# Patient Record
Sex: Male | Born: 1955 | Race: White | Hispanic: No | Marital: Married | Smoking: Former smoker
Health system: Southern US, Community
[De-identification: ages and names within clinical notes are randomized; demographics above are authoritative.]

## PROBLEM LIST (undated history)

## (undated) DIAGNOSIS — R5383 Other fatigue: Principal | ICD-10-CM

## (undated) DIAGNOSIS — N289 Disorder of kidney and ureter, unspecified: Secondary | ICD-10-CM

## (undated) DIAGNOSIS — Z85818 Personal history of malignant neoplasm of other sites of lip, oral cavity, and pharynx: Secondary | ICD-10-CM

## (undated) DIAGNOSIS — I2510 Atherosclerotic heart disease of native coronary artery without angina pectoris: Secondary | ICD-10-CM

## (undated) DIAGNOSIS — I10 Essential (primary) hypertension: Secondary | ICD-10-CM

## (undated) DIAGNOSIS — E039 Hypothyroidism, unspecified: Secondary | ICD-10-CM

## (undated) DIAGNOSIS — R634 Abnormal weight loss: Secondary | ICD-10-CM

## (undated) DIAGNOSIS — C801 Malignant (primary) neoplasm, unspecified: Secondary | ICD-10-CM

## (undated) HISTORY — DX: Other fatigue: R53.83

## (undated) HISTORY — PX: TONSILLECTOMY: SUR1361

## (undated) HISTORY — DX: Hypothyroidism, unspecified: E03.9

## (undated) HISTORY — DX: Essential (primary) hypertension: I10

## (undated) HISTORY — DX: Personal history of malignant neoplasm of other sites of lip, oral cavity, and pharynx: Z85.818

## (undated) HISTORY — DX: Abnormal weight loss: R63.4

## (undated) HISTORY — DX: Disorder of kidney and ureter, unspecified: N28.9

## (undated) HISTORY — PX: MANDIBLE SURGERY: SHX707

## (undated) HISTORY — DX: Atherosclerotic heart disease of native coronary artery without angina pectoris: I25.10

## (undated) HISTORY — PX: OTHER SURGICAL HISTORY: SHX169

---

## 2008-11-03 ENCOUNTER — Encounter: Admission: RE | Admit: 2008-11-03 | Discharge: 2008-11-03 | Payer: Self-pay | Admitting: General Surgery

## 2008-11-05 ENCOUNTER — Other Ambulatory Visit: Admission: RE | Admit: 2008-11-05 | Discharge: 2008-11-05 | Payer: Self-pay | Admitting: Otolaryngology

## 2008-11-08 ENCOUNTER — Emergency Department (HOSPITAL_COMMUNITY): Admission: EM | Admit: 2008-11-08 | Discharge: 2008-11-09 | Payer: Self-pay | Admitting: Emergency Medicine

## 2008-12-02 ENCOUNTER — Ambulatory Visit: Admission: RE | Admit: 2008-12-02 | Discharge: 2009-03-02 | Payer: Self-pay | Admitting: Radiation Oncology

## 2008-12-03 ENCOUNTER — Ambulatory Visit: Payer: Self-pay | Admitting: Oncology

## 2008-12-05 LAB — CBC WITH DIFFERENTIAL/PLATELET
BASO%: 0.3 % (ref 0.0–2.0)
HCT: 37.6 % — ABNORMAL LOW (ref 38.4–49.9)
LYMPH%: 30.3 % (ref 14.0–49.0)
MCH: 31.5 pg (ref 27.2–33.4)
MCHC: 35.3 g/dL (ref 32.0–36.0)
MCV: 89.3 fL (ref 79.3–98.0)
MONO#: 0.3 10*3/uL (ref 0.1–0.9)
MONO%: 3.9 % (ref 0.0–14.0)
NEUT%: 64.1 % (ref 39.0–75.0)
Platelets: 251 10*3/uL (ref 140–400)
WBC: 6.9 10*3/uL (ref 4.0–10.3)

## 2008-12-05 LAB — COMPREHENSIVE METABOLIC PANEL
AST: 16 U/L (ref 0–37)
Albumin: 3.8 g/dL (ref 3.5–5.2)
CO2: 23 mEq/L (ref 19–32)
Calcium: 8.9 mg/dL (ref 8.4–10.5)
Total Protein: 7 g/dL (ref 6.0–8.3)

## 2008-12-09 ENCOUNTER — Ambulatory Visit: Payer: Self-pay | Admitting: Dentistry

## 2008-12-09 ENCOUNTER — Encounter: Admission: AD | Admit: 2008-12-09 | Discharge: 2008-12-09 | Payer: Self-pay | Admitting: Dentistry

## 2008-12-16 ENCOUNTER — Ambulatory Visit (HOSPITAL_COMMUNITY): Admission: RE | Admit: 2008-12-16 | Discharge: 2008-12-16 | Payer: Self-pay | Admitting: Radiation Oncology

## 2008-12-29 LAB — CBC WITH DIFFERENTIAL/PLATELET
BASO%: 0.2 % (ref 0.0–2.0)
Basophils Absolute: 0 10*3/uL (ref 0.0–0.1)
EOS%: 0.7 % (ref 0.0–7.0)
HGB: 14.6 g/dL (ref 13.0–17.1)
MCH: 31.5 pg (ref 27.2–33.4)
MONO%: 2.4 % (ref 0.0–14.0)
RBC: 4.64 10*6/uL (ref 4.20–5.82)
RDW: 13.2 % (ref 11.0–14.6)
lymph#: 1.2 10*3/uL (ref 0.9–3.3)

## 2008-12-29 LAB — COMPREHENSIVE METABOLIC PANEL
ALT: 18 U/L (ref 0–53)
AST: 15 U/L (ref 0–37)
Albumin: 4.6 g/dL (ref 3.5–5.2)
Alkaline Phosphatase: 60 U/L (ref 39–117)
BUN: 15 mg/dL (ref 6–23)
Calcium: 9.8 mg/dL (ref 8.4–10.5)
Chloride: 101 mEq/L (ref 96–112)
Potassium: 4.2 mEq/L (ref 3.5–5.3)
Sodium: 138 mEq/L (ref 135–145)
Total Protein: 7.7 g/dL (ref 6.0–8.3)

## 2009-01-08 ENCOUNTER — Ambulatory Visit: Payer: Self-pay | Admitting: Oncology

## 2009-01-12 LAB — BASIC METABOLIC PANEL
BUN: 16 mg/dL (ref 6–23)
CO2: 23 mEq/L (ref 19–32)
Calcium: 9.1 mg/dL (ref 8.4–10.5)
Creatinine, Ser: 0.81 mg/dL (ref 0.40–1.50)
Glucose, Bld: 118 mg/dL — ABNORMAL HIGH (ref 70–99)

## 2009-01-19 LAB — CBC WITH DIFFERENTIAL/PLATELET
BASO%: 0.3 % (ref 0.0–2.0)
EOS%: 0.5 % (ref 0.0–7.0)
MCH: 31.2 pg (ref 27.2–33.4)
MCHC: 35.2 g/dL (ref 32.0–36.0)
MONO#: 0.3 10*3/uL (ref 0.1–0.9)
NEUT%: 78.9 % — ABNORMAL HIGH (ref 39.0–75.0)
RBC: 4.34 10*6/uL (ref 4.20–5.82)
WBC: 5.9 10*3/uL (ref 4.0–10.3)
lymph#: 0.9 10*3/uL (ref 0.9–3.3)

## 2009-01-19 LAB — COMPREHENSIVE METABOLIC PANEL
ALT: 30 U/L (ref 0–53)
AST: 16 U/L (ref 0–37)
CO2: 24 mEq/L (ref 19–32)
Calcium: 9.4 mg/dL (ref 8.4–10.5)
Chloride: 98 mEq/L (ref 96–112)
Creatinine, Ser: 1.38 mg/dL (ref 0.40–1.50)
Sodium: 135 mEq/L (ref 135–145)
Total Bilirubin: 0.5 mg/dL (ref 0.3–1.2)
Total Protein: 7.4 g/dL (ref 6.0–8.3)

## 2009-01-26 LAB — COMPREHENSIVE METABOLIC PANEL
ALT: 10 U/L (ref 0–53)
AST: 10 U/L (ref 0–37)
Alkaline Phosphatase: 65 U/L (ref 39–117)
Creatinine, Ser: 1.18 mg/dL (ref 0.40–1.50)
Sodium: 137 mEq/L (ref 135–145)
Total Bilirubin: 0.4 mg/dL (ref 0.3–1.2)
Total Protein: 7 g/dL (ref 6.0–8.3)

## 2009-01-26 LAB — CBC WITH DIFFERENTIAL/PLATELET
BASO%: 0.1 % (ref 0.0–2.0)
EOS%: 1.8 % (ref 0.0–7.0)
HCT: 35.5 % — ABNORMAL LOW (ref 38.4–49.9)
LYMPH%: 12.2 % — ABNORMAL LOW (ref 14.0–49.0)
MCH: 30.6 pg (ref 27.2–33.4)
MCHC: 35.2 g/dL (ref 32.0–36.0)
MONO%: 3.4 % (ref 0.0–14.0)
NEUT%: 82.5 % — ABNORMAL HIGH (ref 39.0–75.0)
Platelets: 133 10*3/uL — ABNORMAL LOW (ref 140–400)
RBC: 4.09 10*6/uL — ABNORMAL LOW (ref 4.20–5.82)
WBC: 6.7 10*3/uL (ref 4.0–10.3)

## 2009-02-02 ENCOUNTER — Ambulatory Visit: Payer: Self-pay | Admitting: Oncology

## 2009-02-02 LAB — CBC WITH DIFFERENTIAL/PLATELET
Basophils Absolute: 0 10*3/uL (ref 0.0–0.1)
EOS%: 5.5 % (ref 0.0–7.0)
MCH: 30.4 pg (ref 27.2–33.4)
MCHC: 35.2 g/dL (ref 32.0–36.0)
MCV: 86.4 fL (ref 79.3–98.0)
MONO%: 7.6 % (ref 0.0–14.0)
RBC: 4.04 10*6/uL — ABNORMAL LOW (ref 4.20–5.82)
RDW: 12.9 % (ref 11.0–14.6)

## 2009-02-02 LAB — COMPREHENSIVE METABOLIC PANEL
AST: 11 U/L (ref 0–37)
Albumin: 4.2 g/dL (ref 3.5–5.2)
Alkaline Phosphatase: 63 U/L (ref 39–117)
BUN: 29 mg/dL — ABNORMAL HIGH (ref 6–23)
Potassium: 4.3 mEq/L (ref 3.5–5.3)

## 2009-02-05 LAB — CBC WITH DIFFERENTIAL/PLATELET
Basophils Absolute: 0 10*3/uL (ref 0.0–0.1)
Eosinophils Absolute: 0.3 10*3/uL (ref 0.0–0.5)
HGB: 11.4 g/dL — ABNORMAL LOW (ref 13.0–17.1)
LYMPH%: 3.8 % — ABNORMAL LOW (ref 14.0–49.0)
MCV: 88.3 fL (ref 79.3–98.0)
MONO%: 3 % (ref 0.0–14.0)
NEUT#: 3.9 10*3/uL (ref 1.5–6.5)
Platelets: 209 10*3/uL (ref 140–400)
RBC: 3.66 10*6/uL — ABNORMAL LOW (ref 4.20–5.82)

## 2009-02-05 LAB — COMPREHENSIVE METABOLIC PANEL
Alkaline Phosphatase: 58 U/L (ref 39–117)
BUN: 44 mg/dL — ABNORMAL HIGH (ref 6–23)
Creatinine, Ser: 3.13 mg/dL — ABNORMAL HIGH (ref 0.40–1.50)
Glucose, Bld: 133 mg/dL — ABNORMAL HIGH (ref 70–99)
Total Bilirubin: 0.8 mg/dL (ref 0.3–1.2)

## 2009-02-10 LAB — CBC WITH DIFFERENTIAL/PLATELET
Basophils Absolute: 0 10*3/uL (ref 0.0–0.1)
Eosinophils Absolute: 0.1 10*3/uL (ref 0.0–0.5)
HGB: 10.8 g/dL — ABNORMAL LOW (ref 13.0–17.1)
LYMPH%: 11.8 % — ABNORMAL LOW (ref 14.0–49.0)
MCV: 88 fL (ref 79.3–98.0)
MONO%: 4.7 % (ref 0.0–14.0)
NEUT#: 3.3 10*3/uL (ref 1.5–6.5)
Platelets: 188 10*3/uL (ref 140–400)

## 2009-02-10 LAB — COMPREHENSIVE METABOLIC PANEL
Albumin: 3.6 g/dL (ref 3.5–5.2)
Alkaline Phosphatase: 57 U/L (ref 39–117)
BUN: 17 mg/dL (ref 6–23)
CO2: 26 mEq/L (ref 19–32)
Glucose, Bld: 110 mg/dL — ABNORMAL HIGH (ref 70–99)
Potassium: 3.6 mEq/L (ref 3.5–5.3)

## 2009-02-16 LAB — BASIC METABOLIC PANEL
BUN: 18 mg/dL (ref 6–23)
CO2: 24 mEq/L (ref 19–32)
Chloride: 102 mEq/L (ref 96–112)
Glucose, Bld: 136 mg/dL — ABNORMAL HIGH (ref 70–99)
Potassium: 3.9 mEq/L (ref 3.5–5.3)
Sodium: 136 mEq/L (ref 135–145)

## 2009-02-16 LAB — CBC WITH DIFFERENTIAL/PLATELET
Basophils Absolute: 0 10*3/uL (ref 0.0–0.1)
Eosinophils Absolute: 0.1 10*3/uL (ref 0.0–0.5)
HGB: 9.3 g/dL — ABNORMAL LOW (ref 13.0–17.1)
MCV: 88.6 fL (ref 79.3–98.0)
MONO#: 0.1 10*3/uL (ref 0.1–0.9)
MONO%: 7.6 % (ref 0.0–14.0)
NEUT#: 1.1 10*3/uL — ABNORMAL LOW (ref 1.5–6.5)
RDW: 12.9 % (ref 11.0–14.6)
WBC: 1.6 10*3/uL — ABNORMAL LOW (ref 4.0–10.3)
lymph#: 0.3 10*3/uL — ABNORMAL LOW (ref 0.9–3.3)

## 2009-02-25 ENCOUNTER — Ambulatory Visit: Payer: Self-pay | Admitting: Dentistry

## 2009-03-03 ENCOUNTER — Ambulatory Visit: Admission: RE | Admit: 2009-03-03 | Discharge: 2009-04-02 | Payer: Self-pay | Admitting: Radiation Oncology

## 2009-03-18 ENCOUNTER — Ambulatory Visit: Payer: Self-pay | Admitting: Oncology

## 2009-03-18 LAB — CBC WITH DIFFERENTIAL/PLATELET
EOS%: 1 % (ref 0.0–7.0)
MCH: 31.7 pg (ref 27.2–33.4)
MCV: 90 fL (ref 79.3–98.0)
MONO%: 3.9 % (ref 0.0–14.0)
NEUT#: 5.3 10*3/uL (ref 1.5–6.5)
RBC: 3.47 10*6/uL — ABNORMAL LOW (ref 4.20–5.82)
RDW: 14.2 % (ref 11.0–14.6)
lymph#: 0.4 10*3/uL — ABNORMAL LOW (ref 0.9–3.3)

## 2009-03-18 LAB — COMPREHENSIVE METABOLIC PANEL
ALT: 13 U/L (ref 0–53)
AST: 13 U/L (ref 0–37)
Albumin: 4.2 g/dL (ref 3.5–5.2)
Alkaline Phosphatase: 70 U/L (ref 39–117)
Potassium: 3.9 mEq/L (ref 3.5–5.3)
Sodium: 138 mEq/L (ref 135–145)
Total Protein: 7.4 g/dL (ref 6.0–8.3)

## 2009-03-31 ENCOUNTER — Ambulatory Visit (HOSPITAL_COMMUNITY): Admission: RE | Admit: 2009-03-31 | Discharge: 2009-03-31 | Payer: Self-pay | Admitting: Oncology

## 2009-04-22 ENCOUNTER — Ambulatory Visit (HOSPITAL_COMMUNITY): Admission: RE | Admit: 2009-04-22 | Discharge: 2009-04-22 | Payer: Self-pay | Admitting: Radiation Oncology

## 2009-04-28 ENCOUNTER — Ambulatory Visit: Payer: Self-pay | Admitting: Dentistry

## 2009-06-02 ENCOUNTER — Ambulatory Visit: Payer: Self-pay | Admitting: Oncology

## 2009-06-02 ENCOUNTER — Ambulatory Visit (HOSPITAL_COMMUNITY): Admission: RE | Admit: 2009-06-02 | Discharge: 2009-06-02 | Payer: Self-pay | Admitting: Oncology

## 2009-06-04 LAB — CBC WITH DIFFERENTIAL/PLATELET
Basophils Absolute: 0 10*3/uL (ref 0.0–0.1)
EOS%: 1.1 % (ref 0.0–7.0)
HCT: 35.3 % — ABNORMAL LOW (ref 38.4–49.9)
HGB: 12.4 g/dL — ABNORMAL LOW (ref 13.0–17.1)
MCH: 32.7 pg (ref 27.2–33.4)
MONO#: 0.2 10*3/uL (ref 0.1–0.9)
NEUT#: 3.4 10*3/uL (ref 1.5–6.5)
NEUT%: 78.4 % — ABNORMAL HIGH (ref 39.0–75.0)
RDW: 12.9 % (ref 11.0–14.6)
WBC: 4.4 10*3/uL (ref 4.0–10.3)
lymph#: 0.7 10*3/uL — ABNORMAL LOW (ref 0.9–3.3)

## 2009-06-04 LAB — COMPREHENSIVE METABOLIC PANEL
ALT: 10 U/L (ref 0–53)
AST: 11 U/L (ref 0–37)
Albumin: 4.6 g/dL (ref 3.5–5.2)
BUN: 22 mg/dL (ref 6–23)
CO2: 26 mEq/L (ref 19–32)
Calcium: 9.6 mg/dL (ref 8.4–10.5)
Chloride: 104 mEq/L (ref 96–112)
Potassium: 4.5 mEq/L (ref 3.5–5.3)

## 2009-06-04 LAB — TSH: TSH: 2.014 u[IU]/mL (ref 0.350–4.500)

## 2009-09-09 ENCOUNTER — Ambulatory Visit (HOSPITAL_COMMUNITY): Admission: RE | Admit: 2009-09-09 | Discharge: 2009-09-09 | Payer: Self-pay | Admitting: Oncology

## 2009-09-14 ENCOUNTER — Ambulatory Visit: Payer: Self-pay | Admitting: Oncology

## 2009-09-16 LAB — COMPREHENSIVE METABOLIC PANEL
AST: 12 U/L (ref 0–37)
Albumin: 4.7 g/dL (ref 3.5–5.2)
Alkaline Phosphatase: 46 U/L (ref 39–117)
Calcium: 9.4 mg/dL (ref 8.4–10.5)
Creatinine, Ser: 1.38 mg/dL (ref 0.40–1.50)
Glucose, Bld: 107 mg/dL — ABNORMAL HIGH (ref 70–99)
Potassium: 4.4 mEq/L (ref 3.5–5.3)
Total Bilirubin: 0.4 mg/dL (ref 0.3–1.2)
Total Protein: 7.5 g/dL (ref 6.0–8.3)

## 2009-09-16 LAB — CBC WITH DIFFERENTIAL/PLATELET
BASO%: 0.2 % (ref 0.0–2.0)
Basophils Absolute: 0 10*3/uL (ref 0.0–0.1)
EOS%: 1 % (ref 0.0–7.0)
HGB: 12.1 g/dL — ABNORMAL LOW (ref 13.0–17.1)
LYMPH%: 16.6 % (ref 14.0–49.0)
MONO#: 0.2 10*3/uL (ref 0.1–0.9)
MONO%: 5 % (ref 0.0–14.0)
NEUT#: 2.9 10*3/uL (ref 1.5–6.5)
NEUT%: 77.2 % — ABNORMAL HIGH (ref 39.0–75.0)
RDW: 13.2 % (ref 11.0–14.6)

## 2009-11-23 ENCOUNTER — Ambulatory Visit: Payer: Self-pay | Admitting: Oncology

## 2009-11-25 LAB — CBC WITH DIFFERENTIAL/PLATELET
Basophils Absolute: 0 10*3/uL (ref 0.0–0.1)
HCT: 33.4 % — ABNORMAL LOW (ref 38.4–49.9)
HGB: 11.9 g/dL — ABNORMAL LOW (ref 13.0–17.1)
MCH: 32.9 pg (ref 27.2–33.4)
MCHC: 35.6 g/dL (ref 32.0–36.0)
MCV: 92.5 fL (ref 79.3–98.0)
MONO%: 5.5 % (ref 0.0–14.0)
NEUT#: 3 10*3/uL (ref 1.5–6.5)
Platelets: 226 10*3/uL (ref 140–400)
RBC: 3.61 10*6/uL — ABNORMAL LOW (ref 4.20–5.82)
RDW: 13 % (ref 11.0–14.6)

## 2009-11-25 LAB — COMPREHENSIVE METABOLIC PANEL
Chloride: 101 mEq/L (ref 96–112)
Glucose, Bld: 102 mg/dL — ABNORMAL HIGH (ref 70–99)
Potassium: 4.5 mEq/L (ref 3.5–5.3)
Total Bilirubin: 0.5 mg/dL (ref 0.3–1.2)
Total Protein: 6.8 g/dL (ref 6.0–8.3)

## 2010-03-25 ENCOUNTER — Ambulatory Visit: Payer: Self-pay | Admitting: Oncology

## 2010-03-29 ENCOUNTER — Ambulatory Visit (HOSPITAL_COMMUNITY): Admission: RE | Admit: 2010-03-29 | Discharge: 2010-03-29 | Payer: Self-pay | Admitting: Oncology

## 2010-03-29 LAB — CBC WITH DIFFERENTIAL/PLATELET
BASO%: 0.3 % (ref 0.0–2.0)
MCH: 33.3 pg (ref 27.2–33.4)
MCV: 95.2 fL (ref 79.3–98.0)
MONO#: 0.3 10*3/uL (ref 0.1–0.9)
MONO%: 5.5 % (ref 0.0–14.0)
RBC: 3.97 10*6/uL — ABNORMAL LOW (ref 4.20–5.82)
RDW: 14 % (ref 11.0–14.6)

## 2010-03-29 LAB — COMPREHENSIVE METABOLIC PANEL
ALT: 11 U/L (ref 0–53)
AST: 13 U/L (ref 0–37)
Albumin: 4.6 g/dL (ref 3.5–5.2)
BUN: 19 mg/dL (ref 6–23)
Chloride: 102 mEq/L (ref 96–112)
Creatinine, Ser: 1.34 mg/dL (ref 0.40–1.50)
Glucose, Bld: 107 mg/dL — ABNORMAL HIGH (ref 70–99)
Sodium: 136 mEq/L (ref 135–145)
Total Protein: 7.3 g/dL (ref 6.0–8.3)

## 2010-03-29 LAB — LACTATE DEHYDROGENASE: LDH: 111 U/L (ref 94–250)

## 2010-07-23 ENCOUNTER — Other Ambulatory Visit: Payer: Self-pay | Admitting: Radiation Oncology

## 2010-07-24 ENCOUNTER — Other Ambulatory Visit: Payer: Self-pay | Admitting: Radiation Oncology

## 2010-07-25 ENCOUNTER — Encounter: Payer: Self-pay | Admitting: Radiation Oncology

## 2010-07-26 ENCOUNTER — Encounter: Payer: Self-pay | Admitting: Radiation Oncology

## 2010-07-26 ENCOUNTER — Encounter: Payer: Self-pay | Admitting: Oncology

## 2010-08-09 ENCOUNTER — Other Ambulatory Visit (HOSPITAL_COMMUNITY): Payer: Self-pay | Admitting: Oncology

## 2010-08-09 ENCOUNTER — Encounter (HOSPITAL_BASED_OUTPATIENT_CLINIC_OR_DEPARTMENT_OTHER): Payer: 59 | Admitting: Oncology

## 2010-08-09 DIAGNOSIS — C099 Malignant neoplasm of tonsil, unspecified: Secondary | ICD-10-CM

## 2010-08-09 LAB — CBC WITH DIFFERENTIAL/PLATELET
BASO%: 0.2 % (ref 0.0–2.0)
Basophils Absolute: 0 10*3/uL (ref 0.0–0.1)
Eosinophils Absolute: 0.1 10*3/uL (ref 0.0–0.5)
MCH: 32.6 pg (ref 27.2–33.4)
MCHC: 34.8 g/dL (ref 32.0–36.0)
MONO#: 0.2 10*3/uL (ref 0.1–0.9)
NEUT%: 74.3 % (ref 39.0–75.0)
Platelets: 195 10*3/uL (ref 140–400)
WBC: 3.8 10*3/uL — ABNORMAL LOW (ref 4.0–10.3)
lymph#: 0.7 10*3/uL — ABNORMAL LOW (ref 0.9–3.3)

## 2010-08-09 LAB — COMPREHENSIVE METABOLIC PANEL
AST: 11 U/L (ref 0–37)
Albumin: 4.5 g/dL (ref 3.5–5.2)
Alkaline Phosphatase: 41 U/L (ref 39–117)
CO2: 23 mEq/L (ref 19–32)
Chloride: 100 mEq/L (ref 96–112)
Creatinine, Ser: 1.28 mg/dL (ref 0.40–1.50)
Glucose, Bld: 109 mg/dL — ABNORMAL HIGH (ref 70–99)
Potassium: 4.6 mEq/L (ref 3.5–5.3)
Total Bilirubin: 0.4 mg/dL (ref 0.3–1.2)
Total Protein: 7 g/dL (ref 6.0–8.3)

## 2010-08-09 LAB — LACTATE DEHYDROGENASE: LDH: 104 U/L (ref 94–250)

## 2010-08-10 ENCOUNTER — Encounter (HOSPITAL_COMMUNITY): Payer: Self-pay

## 2010-08-10 ENCOUNTER — Ambulatory Visit (HOSPITAL_COMMUNITY)
Admission: RE | Admit: 2010-08-10 | Discharge: 2010-08-10 | Disposition: A | Discharge status: Home / Self Care | Payer: 59 | Source: Ambulatory Visit | Attending: Radiation Oncology | Admitting: Radiation Oncology

## 2010-08-10 DIAGNOSIS — M47812 Spondylosis without myelopathy or radiculopathy, cervical region: Secondary | ICD-10-CM | POA: Insufficient documentation

## 2010-08-10 DIAGNOSIS — C099 Malignant neoplasm of tonsil, unspecified: Secondary | ICD-10-CM | POA: Insufficient documentation

## 2010-08-10 DIAGNOSIS — Z09 Encounter for follow-up examination after completed treatment for conditions other than malignant neoplasm: Secondary | ICD-10-CM | POA: Insufficient documentation

## 2010-08-10 HISTORY — DX: Malignant (primary) neoplasm, unspecified: C80.1

## 2010-08-10 MED ORDER — IOHEXOL 300 MG/ML  SOLN
100.0000 mL | Freq: Once | INTRAMUSCULAR | Status: AC | PRN
  Administered 2010-08-10: 100 mL via INTRAVENOUS

## 2010-08-17 ENCOUNTER — Encounter (HOSPITAL_BASED_OUTPATIENT_CLINIC_OR_DEPARTMENT_OTHER): Payer: 59 | Admitting: Oncology

## 2010-08-17 DIAGNOSIS — C099 Malignant neoplasm of tonsil, unspecified: Secondary | ICD-10-CM

## 2010-10-11 LAB — GLUCOSE, CAPILLARY: Glucose-Capillary: 119 mg/dL — ABNORMAL HIGH (ref 70–99)

## 2010-10-12 LAB — DIFFERENTIAL
Eosinophils Relative: 1 % (ref 0–5)
Lymphocytes Relative: 23 % (ref 12–46)
Lymphs Abs: 2.2 10*3/uL (ref 0.7–4.0)
Monocytes Relative: 4 % (ref 3–12)

## 2010-10-12 LAB — CBC
HCT: 39.3 % (ref 39.0–52.0)
MCV: 89.3 fL (ref 78.0–100.0)
Platelets: 239 10*3/uL (ref 150–400)
WBC: 9.4 10*3/uL (ref 4.0–10.5)

## 2010-10-12 LAB — POCT I-STAT, CHEM 8
Calcium, Ion: 0.96 mmol/L — ABNORMAL LOW (ref 1.12–1.32)
Creatinine, Ser: 1.1 mg/dL (ref 0.4–1.5)
Glucose, Bld: 101 mg/dL — ABNORMAL HIGH (ref 70–99)
HCT: 39 % (ref 39.0–52.0)
Hemoglobin: 13.3 g/dL (ref 13.0–17.0)
Hemoglobin: 14.3 g/dL (ref 13.0–17.0)
Potassium: 3.1 mEq/L — ABNORMAL LOW (ref 3.5–5.1)
Sodium: 133 mEq/L — ABNORMAL LOW (ref 135–145)

## 2010-11-16 NOTE — Consult Note (Signed)
NAMELYONEL, MOREJON NO.:  [id number]   MEDICAL RECORD NO.:  [id number]          PATIENT TYPE:  REC   LOCATION:  [facility]                         FACILITY:  [facility]   PHYSICIAN:  Charlynne Pander, D.D.S.DATE OF BIRTH:  [birth year]   DATE OF CONSULTATION:  12/09/2008  DATE OF DISCHARGE:                                 CONSULTATION   Samuel Leon is a 55 year old male referred by Dr. [name]  for dental consultation.  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  The patient with anticipated  chemoradiation therapy with Dr. Gaylyn Rong and Dr. Michell Heinrich respectively.  The  patient is now seen as part of pre-chemoradiation therapy dental  protocol evaluation.   MEDICAL HISTORY:  1. 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      a.     Status post right tonsillectomy, direct laryngoscopy, and       multiple biopsies on Nov 19, 2008, with Dr. Christia Reading.       Pathology was positive for squamous cell carcinoma involving the       right tonsil.      b.     Anticipated radiation therapy with Dr. Michell Heinrich.      c.     Anticipated chemotherapy with Dr. Gaylyn Rong.  2. Hypertension.  3. History of leg and wrist fractures as a child.   ALLERGIES:  1. HYDROCODONE causes itching and rash.  2. ASPIRIN causes nausea and vomiting.   MEDICATIONS:  1. Lisinopril and hydrochlorothiazide 20/25 daily.  2. Restoril 15 mg at bedtime as needed.  3. Xanax as needed for anxiety.   SOCIAL HISTORY:  The patient is married with 1 son.  [place]  The patient with a history of drinking 5-6 beers daily but  now only drinks an occasional beer from time to time.  The patient with  a history of smoking, but quit approximately in 1980.   FAMILY HISTORY:  Mother died at age of 54 with kidney failure, end-stage  renal disease, and heart attack.  Father is alive at age 22 with a  history of arthritis, coronary  artery disease, and hypertension.   FUNCTIONAL ASSESSMENT:  The patient currently remains independent for  ADLs at this time.   REVIEW OF SYSTEMS:  This is reviewed with the patient and is included in  the dental consultation record.   DENTAL HISTORY:   CHIEF COMPLAINT:  The patient is a pre-chemoradiation therapy dental  protocol evaluation.   HISTORY OF PRESENT ILLNESS:  The patient with recent diagnosis of  squamous cell carcinoma of the tonsil.  The patient with anticipated  chemoradiation therapy.  The patient is now seen as part of pre-  chemoradiation therapy dental protocol evaluation.   The patient currently denies acute toothache, swellings, or abscesses.  The patient was last seen in February 2010 by Dr. Edmon Crape for an  exam, radiographs, and cleaning.  The patient also had one dental  restoration by report.  The patient does not have  any current pending  treatment by his report.  Prior to that, the patient seen Dr. Irven Easterly on an every 9-month basis, but subsequently failing to  noncompliance over the last several years.  The patient attempted to  reestablish routine dental care with Dr. Langston Masker in February.   DENTAL EXAMINATION:  GENERAL:  The patient is a well-developed, well-  nourished male in no acute distress.  VITAL SIGNS:  Blood pressure is 122/91, pulse rate is 73, and  temperature is 98.2.  HEAD AND NECK:  There is significant right neck lymphadenopathy.  The  patient denies acute TMJ symptoms.  There is no left neck  lymphadenopathy noted at this time.  INTRAORAL:  The patient with normal saliva.  There is no evidence of  abscess formation within the mouth.  DENTITION:  The patient has missing tooth numbers 1, 2, 3, 10, 15, 16,  17, and 19.  PERIODONTAL:  The patient with chronic periodontitis with plaque and  calculus accumulations, selective areas of gingival recession and  incipient-to-moderate bone loss.  DENTAL CARIES:  There are  dental caries noted affecting tooth #24.  CROWN OR BRIDGE:  There are multiple crown or bridge restorations which  appear to be acceptable at this time.  PROSTHODONTIC:  There is no history of partial denture fabrication.  OCCLUSION:  The patient with a poor occlusal scheme secondary to  multiple missing teeth, supereruption, and drifting of the unopposed  teeth into the edentulous areas and lack of replacement of all missing  teeth with dental prosthesis.   RADIOGRAPHIC INTERPRETATION:  A panoramic x-ray was taken and  supplemented with a full series of dental radiographs.   There are multiple missing teeth.  There is incipient-to-moderate bone  loss.  There are dental caries noted.  There are multiple dental  restorations noted.  There are previous root canal therapy associated  with tooth #11 and 12 with no obvious persistent periapical pathology.  There is a radiopaque area between tooth #29 and 30 consistent with an  amalgam tattoo diagnosis.   ASSESSMENT:  1. Plaque and calculus accumulations.  2. Selective areas of gingival recession.  3. Incipient-to-moderate bone loss.  4. Multiple missing teeth.  5. Supereruption and drifting of the unopposed teeth into the      edentulous areas.  6. Dental caries.  7. Poor occlusal scheme but a stable occlusion  8. Pigmented area between tooth #29 and 30 consistent with amalgam      tattoo diagnosis.  Radiopaque area is also noted on the dental x-      rays.  9. Poor occlusal scheme, but a stable occlusion.   PLAN/RECOMMENDATIONS:  1. I discussed risks, benefits, complications, and various treatment      options with the patient in relationship to his medical and dental      conditions and anticipated chemoradiation therapy and      chemoradiation therapy side effects to include xerostomia,      radiation caries, trismus, mucositis, taste changes, gum and jaw      bone changes, risk for infection, bleeding, and osteoradionecrosis.       We discussed various treatment options to include no treatment,      selective extraction of tooth #31 and 32 with alveoloplasty to      achieve primary closure, periodontal therapy, dental restorations,      root canal therapy, crown or bridge therapy, implant therapy, and      replacing missing teeth as indicated.  The patient currently agrees      to be referred to an oral surgeon for evaluation for extraction      tooth #31 and 32 with alveoloplasty to achieve primary closure with      IV sedation.  The consult has been scheduled with Dr. Graylon Gunning      for Thursday, December 11, 2008, at 3:15 p.m. with treatment hopefully      to be provided expediently.  The patient will then be seen by Dr.      Langston Masker for dental restoration on tooth #24.  The patient did agree      to impressions today for fabrication of fluoride trays and cavity      protection devices.  The patient will then follow up with      simulation appointment and PET scan as scheduled with Dr.      Michell Heinrich.  This may need to be changed from the current scheduling      due to overall timing of the dental extractions with Dr. Shea Evans.  2. Provided written and verbal information on chemotherapy and your      mouth as well as radiation therapy and your mouth - today.  3. Impressed upper and lower teeth with alginates with lab pour for      fabrication of fluoride trays and cavity protection devices.  I      also provided the patient with a prescription for Fluoroshield      sodium fluoride for use in the fluoride trays.  4. Discussion of findings with Dr. Gaylyn Rong, Dr. Michell Heinrich, Dr. Shea Evans, and      Dr. Langston Masker as indicated to coordinate future care.      Charlynne Pander, D.D.S.  Electronically Signed     RFK/MEDQ  D:  12/09/2008  T:  12/10/2008  Job:  [id number]   cc:   [name], MD  Jethro Bolus, MD  [name], MD  Edmon Crape, DDS  Sable Feil., D.D.S.

## 2011-02-07 ENCOUNTER — Other Ambulatory Visit (HOSPITAL_COMMUNITY): Payer: Self-pay

## 2011-02-10 ENCOUNTER — Other Ambulatory Visit (HOSPITAL_COMMUNITY): Payer: Self-pay

## 2011-02-14 ENCOUNTER — Other Ambulatory Visit (HOSPITAL_COMMUNITY): Payer: Self-pay

## 2011-02-14 ENCOUNTER — Encounter (HOSPITAL_COMMUNITY)
Admission: RE | Admit: 2011-02-14 | Discharge: 2011-02-14 | Disposition: A | Discharge status: Home / Self Care | Payer: 59 | Source: Ambulatory Visit | Attending: Radiation Oncology | Admitting: Radiation Oncology

## 2011-02-14 ENCOUNTER — Encounter (HOSPITAL_COMMUNITY): Payer: Self-pay

## 2011-02-14 DIAGNOSIS — I2510 Atherosclerotic heart disease of native coronary artery without angina pectoris: Secondary | ICD-10-CM | POA: Insufficient documentation

## 2011-02-14 DIAGNOSIS — C760 Malignant neoplasm of head, face and neck: Secondary | ICD-10-CM | POA: Insufficient documentation

## 2011-02-14 MED ORDER — FLUDEOXYGLUCOSE F - 18 (FDG) INJECTION
17.9000 | Freq: Once | INTRAVENOUS | Status: AC | PRN
  Administered 2011-02-14: 17.9 via INTRAVENOUS

## 2011-02-17 ENCOUNTER — Ambulatory Visit
Admission: RE | Admit: 2011-02-17 | Discharge: 2011-02-17 | Disposition: A | Discharge status: Home / Self Care | Payer: 59 | Source: Ambulatory Visit | Attending: Radiation Oncology | Admitting: Radiation Oncology

## 2011-03-01 ENCOUNTER — Other Ambulatory Visit (HOSPITAL_COMMUNITY): Payer: Self-pay | Admitting: Oncology

## 2011-03-01 ENCOUNTER — Encounter (HOSPITAL_BASED_OUTPATIENT_CLINIC_OR_DEPARTMENT_OTHER): Payer: 59 | Admitting: Oncology

## 2011-03-01 DIAGNOSIS — G619 Inflammatory polyneuropathy, unspecified: Secondary | ICD-10-CM

## 2011-03-01 DIAGNOSIS — C099 Malignant neoplasm of tonsil, unspecified: Secondary | ICD-10-CM

## 2011-03-01 LAB — CBC WITH DIFFERENTIAL/PLATELET
BASO%: 0.6 % (ref 0.0–2.0)
Basophils Absolute: 0 10*3/uL (ref 0.0–0.1)
EOS%: 2.4 % (ref 0.0–7.0)
MCH: 32.4 pg (ref 27.2–33.4)
MCHC: 35.2 g/dL (ref 32.0–36.0)
MCV: 92 fL (ref 79.3–98.0)
MONO%: 7 % (ref 0.0–14.0)
RDW: 13 % (ref 11.0–14.6)
lymph#: 0.6 10*3/uL — ABNORMAL LOW (ref 0.9–3.3)

## 2011-03-01 LAB — TSH: TSH: 5.874 u[IU]/mL — ABNORMAL HIGH (ref 0.350–4.500)

## 2011-03-01 LAB — COMPREHENSIVE METABOLIC PANEL
CO2: 24 mEq/L (ref 19–32)
Creatinine, Ser: 1.22 mg/dL (ref 0.50–1.35)
Glucose, Bld: 106 mg/dL — ABNORMAL HIGH (ref 70–99)
Total Bilirubin: 0.4 mg/dL (ref 0.3–1.2)

## 2011-03-01 LAB — LACTATE DEHYDROGENASE: LDH: 114 U/L (ref 94–250)

## 2011-03-11 IMAGING — PT NM PET TUM IMG RESTAG (PS) SKULL BASE T - THIGH
6 series · 25 of 25 positions shown · non-contrast
Comparison: Prior PET of 12/16/2008.  Neck CT of 03/31/2009.

CLINICAL DATA: Subsequent treatment strategy for right-sided

NUCLEAR MEDICINE PET CT SKULL BASE TO THIGH
TECHNIQUE: 16.7 mCi F-18 FDG was injected intravenously via the
right AC.  Full-ring PET imaging was performed from the skull base
through the mid-thighs 60  minutes after injection.  CT data was
obtained and used for attenuation correction and anatomic
localization only.  (This was not acquired as a diagnostic CT
examination.) Dedicated supplementary views were obtained of the
neck.
Fasting Blood Glucose:  107

[Series 1: pet ac · axial · 3.3mm · 4.69mm/px · z∈[-312,-17]mm · 5 of 91 slices shown]
[im 1/91]
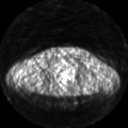
[im 23/91]
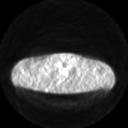
[im 46/91]
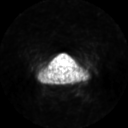
[im 68/91]
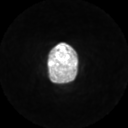
[im 91/91]
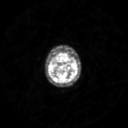

[Series 2: ct h/n · axial · 3.8mm · 0.98mm/px · z∈[-312,-18]mm · 5 of 91 slices shown]
[im 1/91]
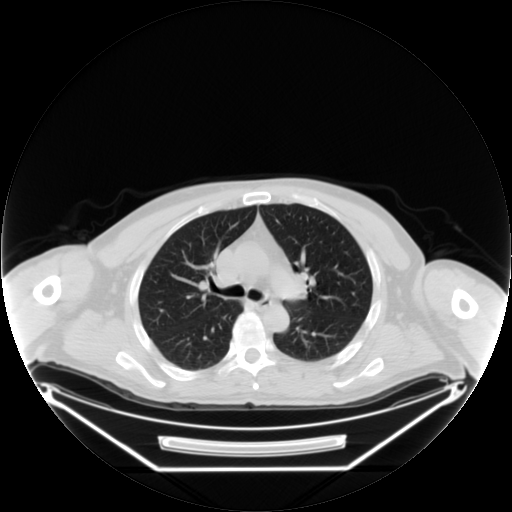
[im 23/91]
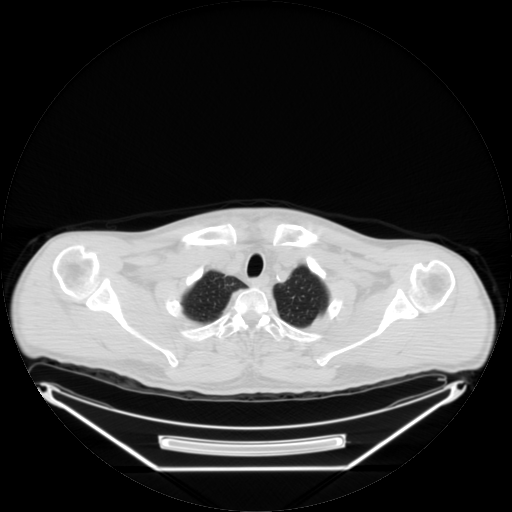
[im 46/91]
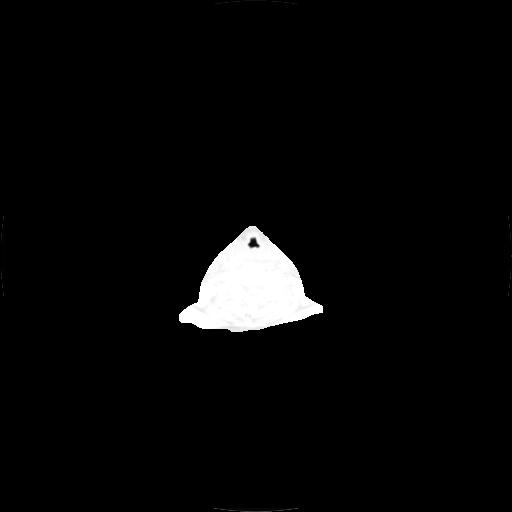
[im 68/91]
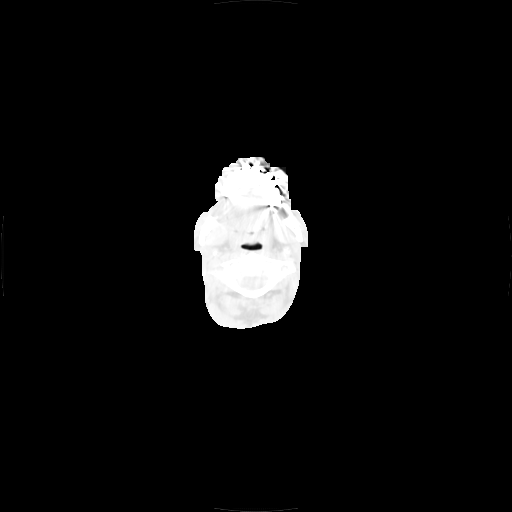
[im 91/91  brain]
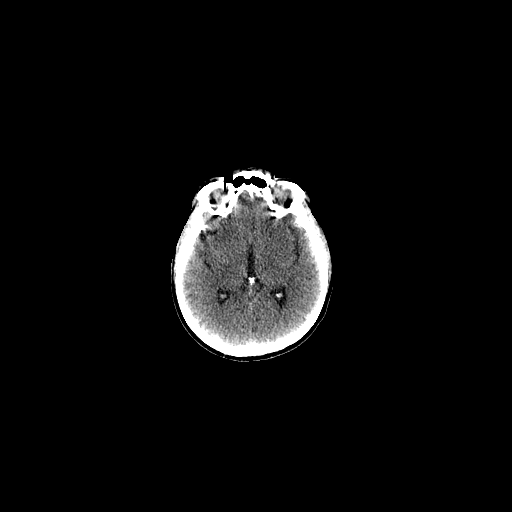

[Series 2: pet nac · axial · 3.3mm · 4.69mm/px · z∈[-312,-17]mm · 5 of 91 slices shown]
[im 1/91]
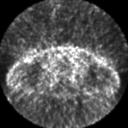
[im 23/91]
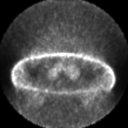
[im 46/91]
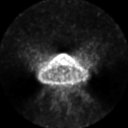
[im 68/91]
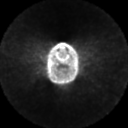
[im 91/91]
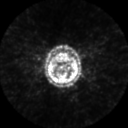

[Series 123: mip · coronal · 3.3mm · 4.69mm/px · 2 of 30 slices shown]
[im 1/30]
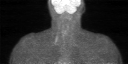
[im 30/30]
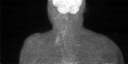

[Series 151: reformatted · axial · 3.3mm · 3.91mm/px · z∈[-312,-17]mm · 5 of 90 slices shown (1 of 2)]
[im 1/90]
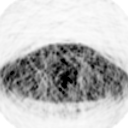
[im 23/90]
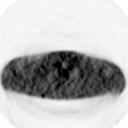
[im 45/90]
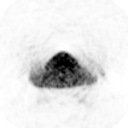
[im 67/90]
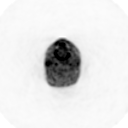
[im 90/90]
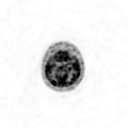

[Series 153: reformatted · coronal · 4.7mm · 4.69mm/px · 3 of 58 slices shown (2 of 2)]
[im 1/58]
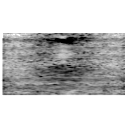
[im 29/58]
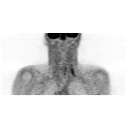
[im 58/58]
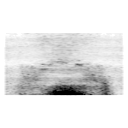

[25 of 25 positions shown; findings below may reference images not displayed]

FINDINGS: PET images demonstrate mild asymmetric right-sided
palatine tonsil hypermetabolism.  Most likely physiologic.  Without
CT correlate.  No hypermetabolic cervical lymph nodes.  No abnormal
activity within the chest, abdomen, or pelvis.

CT images performed for attenuation correction demonstrate the
previous described right-sided jugular chain nodes (on the
03/31/2009 exam) which are poorly visualized due to lack of IV
contrast.  A level II node measures approximately 9 mm on image 35
of series 2 and is similar.

Early but age advanced coronary artery atherosclerosis including
within the LAD on image 103 of series 2.  Urinary bladder wall
thickening versus underdistension.
IMPRESSION: 1.  Response to therapy without evidence of residual hypermetabolic
cervical lymph nodes.
2.  No evidence of extra cervical disease.
3.  Mildly but age advanced coronary artery atherosclerosis.

## 2011-05-23 ENCOUNTER — Telehealth: Payer: Self-pay | Admitting: Medical Oncology

## 2011-05-23 NOTE — Telephone Encounter (Signed)
Pt called and left a message regarding his next appointments

## 2011-05-23 NOTE — Telephone Encounter (Signed)
I called pt back to let them know that we have a new computer system and  the appointments for Jan and Feb. Have  not all been scheduled. I asked her to call me back if he does not hear from the schedulers in the next few weeks to give me a call.

## 2011-07-23 ENCOUNTER — Telehealth: Payer: Self-pay | Admitting: Oncology

## 2011-07-23 NOTE — Telephone Encounter (Signed)
Talked to pt, gave him appt date for 09/06/11, lab and MD

## 2011-09-06 ENCOUNTER — Other Ambulatory Visit: Payer: BC Managed Care – PPO | Admitting: Lab

## 2011-09-06 ENCOUNTER — Encounter: Payer: Self-pay | Admitting: Oncology

## 2011-09-06 ENCOUNTER — Ambulatory Visit (HOSPITAL_BASED_OUTPATIENT_CLINIC_OR_DEPARTMENT_OTHER): Payer: BC Managed Care – PPO | Admitting: Oncology

## 2011-09-06 VITALS — BP 163/95 | HR 98 | Temp 97.6°F | Ht 70.0 in | Wt 187.5 lb

## 2011-09-06 DIAGNOSIS — Z85819 Personal history of malignant neoplasm of unspecified site of lip, oral cavity, and pharynx: Secondary | ICD-10-CM

## 2011-09-06 DIAGNOSIS — E039 Hypothyroidism, unspecified: Secondary | ICD-10-CM

## 2011-09-06 DIAGNOSIS — C099 Malignant neoplasm of tonsil, unspecified: Secondary | ICD-10-CM

## 2011-09-06 LAB — COMPREHENSIVE METABOLIC PANEL
AST: 17 U/L (ref 0–37)
Albumin: 4.2 g/dL (ref 3.5–5.2)
BUN: 16 mg/dL (ref 6–23)
Calcium: 9.7 mg/dL (ref 8.4–10.5)
Chloride: 96 mEq/L (ref 96–112)
Glucose, Bld: 105 mg/dL — ABNORMAL HIGH (ref 70–99)
Potassium: 3.7 mEq/L (ref 3.5–5.3)
Total Protein: 7.5 g/dL (ref 6.0–8.3)

## 2011-09-06 LAB — CBC WITH DIFFERENTIAL/PLATELET
Basophils Absolute: 0 10*3/uL (ref 0.0–0.1)
EOS%: 1.8 % (ref 0.0–7.0)
Eosinophils Absolute: 0.1 10*3/uL (ref 0.0–0.5)
HGB: 13.2 g/dL (ref 13.0–17.1)
NEUT#: 4.1 10*3/uL (ref 1.5–6.5)
RDW: 13.6 % (ref 11.0–14.6)
WBC: 5.3 10*3/uL (ref 4.0–10.3)
lymph#: 0.9 10*3/uL (ref 0.9–3.3)

## 2011-09-06 LAB — TSH: TSH: 3.078 u[IU]/mL (ref 0.350–4.500)

## 2011-09-06 NOTE — Progress Notes (Signed)
CC:   [name], [place] [name], M.D. [name], MD  PROBLEM LIST: 1. Squamous cell carcinoma of the right tonsil, T1 N2b, IVA diagnosed     by biopsy on 11/19/2008.  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  The patient received treatments under the direction of     Dr. Jethro Bolus and Dr. [name].  He received 2 doses of     cisplatin 100 mg/m2 (equal to 210 mg) on 01/12/2009 and 02/02/2009     while at the same time receiving radiation IMRT 70 Gy in 35     fractions from 01/12/2009 through 03/03/2009.  The patient has     remained disease-free since that time. 2. Hypertension. 3. Hypothyroidism. 4. Tinnitus. 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 6. Coronary artery disease by CT scan.  MEDICATIONS: 1. Xanax 0.5 mg at bedtime. 2. Zestoretic 20-25 one tablet daily. 3. Multivitamins. 4. Restoril 15 mg at bedtime. 5. The patient must be on Levothyroxine. However, this is not listed     under his medicines and we do not have his dose.  We will try to     obtain that information.  HISTORY:  I saw Samuel Leon today for followup of his squamous cell carcinoma involving the right tonsil, stage IVA.  Samuel Leon, who is now 56 years old, was last seen by [place] on 03/01/2011.  He feels generally well with no symptoms to suggest recurrent cancer.  He tells me that he saw Dr. Michell Heinrich in August and Dr. Jenne Pane just last month.  There is no evidence for recurrent disease.  He is not having any symptoms related to his primary tumor.  He said that he strained his back yesterday doing some heavy lifting.  This is his only complaint at the present time.  He is eating well and has good energy.  His weight is stable.  PHYSICAL EXAMINATION:  General Appearance:  Samuel Leon looks well.  He looks quite fit and healthy.  Weight is 187.5 pounds.  Height 69 inches. Body surface area 2.05 m2.  Vital Signs:  Blood pressure 163/95.  Other vital signs are normal.   HEENT:  There is no scleral icterus.  Mouth and pharynx are benign.  I was not able to visualize his tonsils.  Neck: Without adenopathy or thyroid enlargement.  Heart:  Normal.  Lungs: Normal.  Abdomen:  Benign with no organomegaly or masses palpable. Extremities:  No peripheral edema or clubbing.  The patient does not have a Port-A-Cath or central catheter.  LABORATORY DATA TODAY:  White count 5.3, ANC 4.1, hemoglobin 13.2, hematocrit 39.1, platelets 238,000.  Chemistries were normal, except for a sodium of 133 and a glucose of 105.  IMAGING STUDIES: 1. CT scan of the head with and without IV contrast obtained on     09/09/2009 showed normal CT appearance of the brain for age. 2. CT scan of the neck with IV contrast on 09/09/2009 showed increased     sequelae of radiation therapy including small retropharyngeal     effusion and diffuse pharyngeal mucosal thickening/edema.  There     was stable residual right neck lymph nodes.  No discrete residual     pharyngeal mass or lymphadenopathy compared with prior studies of     03/31/2009. 3. Chest x-ray, 2 view, from 03/29/2010 showed no active disease. 4. CT scan of the neck with IV contrast from 08/10/2010 showed no     evidence for  recurrent tumor or residual lymphadenopathy. 5. PET/CT scan from 02/14/2011 showed no evidence of recurrent     disease.  There was asymmetric hypermetabolism within the left     parotid gland.  Coronary artery calcifications were noted.  IMPRESSION AND PLAN:  Samuel Leon continues to do well, now almost 3 years from the time of diagnosis.  There is no evidence for recurrent disease.  We will plan to see Samuel Leon again in 6 months at which time we will check CBC, chemistries, and a TSH level.  We will need to determine the dose of thyroid replacement that he is currently taking. In addition, Samuel Leon requested that we send a copy of his labs to his primary care physician.  Patient may need to see a  cardiologist to evaluate for clinically significant coronary artery disease.  ______________________________ [name], M.D. DSM/MEDQ  D:  09/06/2011  T:  09/06/2011  Job:  [id number]

## 2011-09-06 NOTE — Progress Notes (Signed)
This office note has been dictated.  #[id number]

## 2011-09-07 ENCOUNTER — Telehealth: Payer: Self-pay | Admitting: Oncology

## 2011-09-07 ENCOUNTER — Encounter: Payer: Self-pay | Admitting: Medical Oncology

## 2011-09-07 NOTE — Progress Notes (Signed)
Per Dr. Arline Asp I faxed pt's TSH result from 09/06/11. I also faxed PET scan 02/14/11. I enclosed note that Dr. Arline Asp is concerned about coronary artery calcification noted on PET scan, Dr. Arline Asp would like for primary to address regarding referral to cardiologist.

## 2011-09-07 NOTE — Telephone Encounter (Signed)
pt aware of 03/13/12 appt  aom

## 2011-09-07 NOTE — Progress Notes (Signed)
In prior note I forgot to say I faxed labs and PET scan to Delane Ginger with [facility] at [facility].

## 2012-02-16 ENCOUNTER — Ambulatory Visit: Payer: 59 | Admitting: Radiation Oncology

## 2012-02-16 ENCOUNTER — Ambulatory Visit
Admission: RE | Admit: 2012-02-16 | Discharge: 2012-02-16 | Disposition: A | Discharge status: Home / Self Care | Payer: BC Managed Care – PPO | Source: Ambulatory Visit | Attending: Radiation Oncology | Admitting: Radiation Oncology

## 2012-02-16 ENCOUNTER — Encounter: Payer: Self-pay | Admitting: Radiation Oncology

## 2012-02-16 VITALS — BP 166/100 | HR 107 | Temp 97.6°F | Resp 20 | Wt 188.9 lb

## 2012-02-16 DIAGNOSIS — C099 Malignant neoplasm of tonsil, unspecified: Secondary | ICD-10-CM

## 2012-02-16 NOTE — Progress Notes (Signed)
Patient,alert,oriented x3, steady gait,  Patient eating drinking well, had 2 margheritas  Last night, and [place] food with cheese sause, ate bacon,egg, cheese this am, 2 cups coffee, no pain, drinks 4-5 bottled waters daily, meds updated 8:31 AM

## 2012-02-16 NOTE — Progress Notes (Signed)
   Department of Radiation Oncology  Phone:  [phone] Fax:        [phone]   Name: Samuel Leon   DOB: [birth year]  MRN: [id number]    Date: 02/16/2012  Follow Up Visit Note  Diagnosis: T1N2b Squamous Cell Cancer of the right tonsil  Interval since last radiation: 3 years  Interval History: Samuel Leon presents today for routine followup.  He is feeling well and doing well. He still spending the majority of time at the beach. He is using sunscreen. He saw a cardiologist and had a negative stress test. He had some questions about whether he should go on a statin. He is watching what he eats but they're pretty much anyone every once. He's noticed no new neck swelling or pain with swallowing. He still has dry mouth and try to keep himself hydrated with 45 bowels water a day. He is having local followup with his dentist in [place] city. Unfortunately 1 of his friends who was treated about the same time he was is being treated for osteoradionecrosis.  Allergies: No Known Allergies  Medications:  Current Outpatient Prescriptions  Medication Sig Dispense Refill  . ALPRAZolam (XANAX) 0.5 MG tablet Take 0.5 mg by mouth at bedtime as needed.      Marland Kitchen lisinopril (PRINIVIL,ZESTRIL) 20 MG tablet Take 20 mg by mouth Daily.      . Multiple Vitamin (MULTIVITAMIN) tablet Take 1 tablet by mouth daily.      . temazepam (RESTORIL) 15 MG capsule Take 15 mg by mouth at bedtime as needed.      Marland Kitchen TIROSINT 75 MCG CAPS Take 75 mcg by mouth Daily.      Marland Kitchen lisinopril-hydrochlorothiazide (PRINZIDE,ZESTORETIC) 20-25 MG per tablet Take 1 tablet by mouth daily. 1/2 tablet        Physical Exam:   weight is 188 lb 14.4 oz (85.684 kg). His oral temperature is 97.6 F (36.4 C). His blood pressure is 166/100 and his pulse is 107. His respiration is 20.  No palpable cervical supraclavicular or or submandibular adenopathy. His tongue protrudes midline. He has no visible signs of tumor recurrence in the right or  left tonsil on indirect mirror examination. He has no palpable evidence of recurrence in the right or left base of tongue or tonsillar fossa.  IMPRESSION: Samuel Leon is a 56 y.o. male who is no evidence of disease 3 years out from treatment  PLAN:  Care looks great. He 3 years out from treatment. The likelihood of disease recurrence at this point is low. He is scheduled to see Dr. Lorain Childes in September. I released him from followup with me. I will send him a copy of his isodose plans for his reference. I've encouraged him to continue using sunscreen and to let us know before any invasive dental procedures are performed. He agreed to do so. I reviewed his cholesterol levels and his cholesterol is only 1 point over the normal. I thought it would be fine for him to continue trying to make dietary changes before going on a statin. He is going to talk to his PCP about this as well. He knows to contact us with any concerning symptoms.    [name], MD

## 2012-03-13 ENCOUNTER — Ambulatory Visit (HOSPITAL_COMMUNITY)
Admission: RE | Admit: 2012-03-13 | Discharge: 2012-03-13 | Disposition: A | Discharge status: Home / Self Care | Payer: BC Managed Care – PPO | Source: Ambulatory Visit | Attending: Oncology | Admitting: Oncology

## 2012-03-13 ENCOUNTER — Encounter: Payer: Self-pay | Admitting: Oncology

## 2012-03-13 ENCOUNTER — Ambulatory Visit (HOSPITAL_BASED_OUTPATIENT_CLINIC_OR_DEPARTMENT_OTHER): Payer: BC Managed Care – PPO | Admitting: Oncology

## 2012-03-13 ENCOUNTER — Other Ambulatory Visit (HOSPITAL_BASED_OUTPATIENT_CLINIC_OR_DEPARTMENT_OTHER): Payer: BC Managed Care – PPO | Admitting: Lab

## 2012-03-13 ENCOUNTER — Telehealth: Payer: Self-pay | Admitting: Oncology

## 2012-03-13 ENCOUNTER — Telehealth: Payer: Self-pay

## 2012-03-13 VITALS — BP 140/87 | HR 91 | Temp 97.6°F | Resp 18 | Ht 70.0 in | Wt 186.6 lb

## 2012-03-13 DIAGNOSIS — C099 Malignant neoplasm of tonsil, unspecified: Secondary | ICD-10-CM

## 2012-03-13 DIAGNOSIS — I10 Essential (primary) hypertension: Secondary | ICD-10-CM | POA: Insufficient documentation

## 2012-03-13 DIAGNOSIS — G608 Other hereditary and idiopathic neuropathies: Secondary | ICD-10-CM

## 2012-03-13 LAB — COMPREHENSIVE METABOLIC PANEL (CC13)
ALT: 14 U/L (ref 0–55)
BUN: 19 mg/dL (ref 7.0–26.0)
CO2: 24 mEq/L (ref 22–29)
Calcium: 9.5 mg/dL (ref 8.4–10.4)
Chloride: 103 mEq/L (ref 98–107)
Creatinine: 1.3 mg/dL (ref 0.7–1.3)
Total Bilirubin: 0.6 mg/dL (ref 0.20–1.20)

## 2012-03-13 LAB — CBC WITH DIFFERENTIAL/PLATELET
BASO%: 0.5 % (ref 0.0–2.0)
Basophils Absolute: 0 10*3/uL (ref 0.0–0.1)
EOS%: 1.8 % (ref 0.0–7.0)
HCT: 39.9 % (ref 38.4–49.9)
HGB: 13.8 g/dL (ref 13.0–17.1)
MCH: 31.5 pg (ref 27.2–33.4)
MCHC: 34.6 g/dL (ref 32.0–36.0)
MONO#: 0.3 10*3/uL (ref 0.1–0.9)
NEUT%: 74.9 % (ref 39.0–75.0)
RDW: 12.8 % (ref 11.0–14.6)
WBC: 5.5 10*3/uL (ref 4.0–10.3)
lymph#: 1 10*3/uL (ref 0.9–3.3)

## 2012-03-13 LAB — TSH: TSH: 4.89 u[IU]/mL — ABNORMAL HIGH (ref 0.350–4.500)

## 2012-03-13 LAB — LACTATE DEHYDROGENASE (CC13): LDH: 124 U/L — ABNORMAL LOW (ref 125–220)

## 2012-03-13 NOTE — Progress Notes (Signed)
Quick Note:  Please notify patient and call/fax these results to patient's doctors. ______ 

## 2012-03-13 NOTE — Progress Notes (Signed)
CC:   [name], [place] [name], M.D. [name], MD   PROBLEM LIST:  1. Squamous cell carcinoma of the right tonsil, T1 N2b, IVA diagnosed  by biopsy on 11/19/2008. 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 The patient received treatments under the direction of  Dr. Jethro Bolus and Dr. [name]. He received 2 doses of  cisplatin 100 mg/m2 (equal to 210 mg) on 01/12/2009 and 02/02/2009  while at the same time receiving radiation IMRT 70 Gy in 35  fractions from 01/12/2009 through 03/03/2009. The patient has  remained disease-free since that time.  2. Hypertension.  3. Hypothyroidism.  4. Tinnitus.  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  6. Coronary artery disease by CT scan. The patient underwent an exercise stress test in the spring of 2013, which showed no evidence for ischemia, with adequate exercise tolerance.    MEDICATIONS: 1. Xanax 0.25 mg 4 times daily as needed. 2. Lisinopril 20 mg daily. 3. Multivitamins 1 daily. 4. Restoril 15 mg at bedtime as needed. 5. Tirosint 75 mcg daily.  SMOKING HISTORY:  The patient is a nonsmoker.  HISTORY:  Samuel Leon was seen today for followup of his squamous cell carcinoma involving the right tonsil, stage IVA (T1 N2b) with diagnosis going back to May 2010, treated with cisplatin and IMRT radiation.  Tumor was HPV-positive.  [name] was last seen by [place] on 09/06/2011.  In general, he is doing very well, fully active, without any symptoms to suggest recurrent head and neck cancer.  Seems to be eating okay.  No oral complaints.  He was last seen by Dr. Michell Heinrich about a month ago, and she has released him.  There was no evidence for recurrent disease on her exam with indirect mirror examination.  The patient's main problem seems to be intermittent neuropathy involving his hands.  This problem has been going on for at least a couple of years. It is not clear to me whether this may be related to  his 2 doses of cisplatin or whether he could have something like carpal tunnel syndrome or perhaps a peripheral sensory neuropathy unrelated to these possible etiologies.  I suggested that if this problem continues, he may want to see a neurologist or perhaps a hand specialist for further evaluation. The patient states that he had some very transient paresthesias in his feet, but that seems to be improved.  Aside from this, he is fully active, feels well, does a lot of traveling, exercise and enjoying retirement.  Previously he had worked as a Curator for [employer].  PHYSICAL EXAMINATION:  He certainly looks well, seems to be in good condition.  Weight is stable, 186.6 pounds.  Height 5 feet 10 inches, body surface area 2.04 sq. m.  Blood pressure 140/87.  Other vital signs are normal.  There is no scleral icterus.  Mouth and pharynx are benign. I really did not get a terribly good look at the posterior pharynx but did get a transient look at the right tonsil that looked okay.  Do not see any significant radiation changes.  Dentition looks okay.  There is no adenopathy in the neck or other peripheral areas.  Heart/lungs: Normal.  Abdomen:  Benign with no organomegaly or masses palpable. Extremities:  No peripheral edema or clubbing.  Neurologic:  Normal. The patient does not have a Port-A-Cath or central catheter.  LABORATORY DATA:  Today, white count 5.5, ANC 4.1, hemoglobin 13.8, hematocrit 39.9, platelets 195,000.  Chemistries  were normal except for a glucose of 112.  Albumin was 4.1.  LDH 124.  TSH is pending.  TSH from 09/16/2011 was 3.Marland Kitchen078 as compared with 5.874 on 03/01/2011 and 5.874 on 03/02/2011.  IMAGING STUDIES:  1. CT scan of the head with and without IV contrast obtained on  09/09/2009 showed normal CT appearance of the brain for age.  2. CT scan of the neck with IV contrast on 09/09/2009 showed increased  sequelae of radiation therapy including small retropharyngeal   effusion and diffuse pharyngeal mucosal thickening/edema. There  was stable residual right neck lymph nodes. No discrete residual  pharyngeal mass or lymphadenopathy compared with prior studies of  03/31/2009.  3. Chest x-ray, 2 view, from 03/29/2010 showed no active disease.  4. CT scan of the neck with IV contrast from 08/10/2010 showed no  evidence for recurrent tumor or residual lymphadenopathy.  5. PET/CT scan from 02/14/2011 showed no evidence of recurrent  disease. There was asymmetric hypermetabolism within the left  parotid gland. Coronary artery calcifications were noted.   IMPRESSION AND PLAN:  Mr. Sheriff continues to do well, now out almost 3- 1/2 years from the time of diagnosis in May 2010.  He remains disease- free and doing well without any apparent complications related to his treatment.  As stated, he is having some paresthesias in his hands that are intermittent, which may or may not be related to cisplatin.  The patient understands that if this problem becomes more troublesome to him, that he that he may need evaluation with a hand specialist or a neurologist.  I would like the patient to have a chest x-ray, PA and lateral, today. His last imaging study was a PET scan on 02/14/2011.  I do not really think he needs any other scans at this point, but a chest x-ray is not unreasonable.  I have asked the patient to return in 6 months, at which time we will check CBC, chemistries and TSH.    ______________________________ [name], M.D. DSM/MEDQ  D:  03/13/2012  T:  03/13/2012  Job:  [id number]

## 2012-03-13 NOTE — Telephone Encounter (Signed)
lvm that CXR today was [facility]. Faxed results to [employer] [facility]

## 2012-03-13 NOTE — Telephone Encounter (Signed)
Gave pt appt for March 2013 lab and MD, sent patient to Radiology for Chest x-ray

## 2012-03-13 NOTE — Progress Notes (Signed)
This office note has been dictated.  #[id number]

## 2012-03-15 ENCOUNTER — Telehealth: Payer: Self-pay | Admitting: Medical Oncology

## 2012-03-15 NOTE — Telephone Encounter (Signed)
I called pt with chest x-ray results from 03/13/12

## 2012-09-11 ENCOUNTER — Ambulatory Visit: Payer: BC Managed Care – PPO | Admitting: Physician Assistant

## 2012-09-11 ENCOUNTER — Other Ambulatory Visit: Payer: BC Managed Care – PPO | Admitting: Lab

## 2012-09-11 ENCOUNTER — Telehealth: Payer: Self-pay | Admitting: Oncology

## 2012-09-11 NOTE — Telephone Encounter (Signed)
, °

## 2012-10-09 ENCOUNTER — Telehealth: Payer: Self-pay | Admitting: Oncology

## 2012-10-30 ENCOUNTER — Telehealth: Payer: Self-pay

## 2012-10-30 ENCOUNTER — Telehealth: Payer: Self-pay | Admitting: Oncology

## 2012-10-30 ENCOUNTER — Ambulatory Visit (HOSPITAL_BASED_OUTPATIENT_CLINIC_OR_DEPARTMENT_OTHER): Payer: BC Managed Care – PPO | Admitting: Physician Assistant

## 2012-10-30 ENCOUNTER — Other Ambulatory Visit (HOSPITAL_BASED_OUTPATIENT_CLINIC_OR_DEPARTMENT_OTHER): Payer: BC Managed Care – PPO | Admitting: Lab

## 2012-10-30 VITALS — BP 142/87 | HR 78 | Temp 97.3°F | Resp 20 | Ht 70.0 in | Wt 195.9 lb

## 2012-10-30 DIAGNOSIS — B977 Papillomavirus as the cause of diseases classified elsewhere: Secondary | ICD-10-CM

## 2012-10-30 DIAGNOSIS — F3289 Other specified depressive episodes: Secondary | ICD-10-CM

## 2012-10-30 DIAGNOSIS — G5690 Unspecified mononeuropathy of unspecified upper limb: Secondary | ICD-10-CM

## 2012-10-30 DIAGNOSIS — C099 Malignant neoplasm of tonsil, unspecified: Secondary | ICD-10-CM

## 2012-10-30 DIAGNOSIS — F329 Major depressive disorder, single episode, unspecified: Secondary | ICD-10-CM

## 2012-10-30 LAB — CBC WITH DIFFERENTIAL/PLATELET
Basophils Absolute: 0 10*3/uL (ref 0.0–0.1)
EOS%: 1.9 % (ref 0.0–7.0)
HGB: 14 g/dL (ref 13.0–17.1)
MCH: 31.5 pg (ref 27.2–33.4)
MCV: 90.8 fL (ref 79.3–98.0)
MONO%: 4.5 % (ref 0.0–14.0)
RDW: 13.2 % (ref 11.0–14.6)

## 2012-10-30 LAB — COMPREHENSIVE METABOLIC PANEL (CC13)
AST: 13 U/L (ref 5–34)
Albumin: 4.1 g/dL (ref 3.5–5.0)
Alkaline Phosphatase: 55 U/L (ref 40–150)
BUN: 18.8 mg/dL (ref 7.0–26.0)
Creatinine: 1.6 mg/dL — ABNORMAL HIGH (ref 0.7–1.3)
Potassium: 4.8 mEq/L (ref 3.5–5.1)
Total Bilirubin: 0.38 mg/dL (ref 0.20–1.20)

## 2012-10-30 NOTE — Patient Instructions (Signed)
Follow up in 6 months with Dr. Arline Asp with labs

## 2012-10-30 NOTE — Telephone Encounter (Signed)
Labs 10/30/12 faxed to [facility] with [facility] per Dr Arline Asp

## 2012-10-30 NOTE — Telephone Encounter (Signed)
gv pt appt schedule for October.  °

## 2012-10-30 NOTE — Progress Notes (Signed)
[facility]  [phone]   OFFICE PROGRESS NOTE  Leon,MARK, PA-C  CC: Samuel Leon, M.D.          Samuel Contras, MD   PROBLEM LIST:  1. Squamous cell carcinoma of the right tonsil, T1 N2b, IVA diagnosed by biopsy on 11/19/2008. 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 The patient received treatments under the direction of Dr. Jethro Leon and Dr. Lurline Leon. He received 2 doses of cisplatin 100 mg/m2 (equal to 210 mg) on 01/12/2009 and 02/02/2009 while at the same time receiving radiation IMRT 70 Gy in 35 fractions from 01/12/2009 through 03/03/2009. The patient has  remained disease-free since that time.  2. Hypertension.  3. Hypothyroidism.  4. Tinnitus.  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  6. Coronary artery disease by CT scan. The patient underwent an exercise stress test in the spring of 2013, which showed no evidence for ischemia, with adequate exercise tolerance.      MEDICATIONS: Current Outpatient Prescriptions  Medication Sig Dispense Refill  . ALPRAZolam (XANAX) 0.5 MG tablet Take 0.25 mg by mouth 4 (four) times daily as needed.       Marland Kitchen lisinopril (PRINIVIL,ZESTRIL) 20 MG tablet Take 20 mg by mouth Daily.      . metoprolol succinate (TOPROL-XL) 50 MG 24 hr tablet       . Multiple Vitamin (MULTIVITAMIN) tablet Take 1 tablet by mouth daily.      . temazepam (RESTORIL) 15 MG capsule Take 15 mg by mouth at bedtime as needed.      Marland Kitchen TIROSINT 75 MCG CAPS Take 75 mcg by mouth Daily.       No current facility-administered medications for this visit.   SMOKING HISTORY: The patient is a nonsmoker.  ALLERGIES:  has No Known Allergies.     HISTORY:Samuel Leon was seen today for followup of his squamous cell carcinoma involving the right tonsil, stage IVA (T1 N2b) with diagnosis going back to May 2010, treated with cisplatin and IMRT radiation. Tumor was HPV-positive. Mr. Samuel Leon was last seen by [place] on 03/13/12 and  prior to that on  09/06/2011. In general, he is  without any symptoms to suggest recurrent head and neck cancer. No oral complaints.Appetite is adequate.  He was last seen by Dr. Michell Leon about  7 months ago, and she has released him. There was no evidence for recurrent disease on her exam with indirect mirror examination. The patient's main problem seems to be intermittent neuropathy involving his hands. This problem has been going on for at least 2 1/2 years. He reports that these symptoms are worse than in prior visit, now interfering with his activities of daily been, including holding objects, such as his cell phone.he is still able to dress himself. In addition, he reports the same neuropathic complaints on his feet, which at times caused him to lose balance. He denies any headache nausea or vomiting. He does have tinnitus as well, which has not resolved.  He has still not seen a neurologist, despite recommendations by Dr. Arline Asp.  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 His wife reports that there may be a on and off depression as well. She is not up-to-date with his yearly physicals, and he is still has not had a colonoscopy. He was supposed to followup with Dr.Bates, now, he is about a year overdue on that visit. His last chest x-ray  on 03/16/2012 was normal, with no evidence of acute disease.   PHYSICAL EXAMINATION:  Filed Vitals:   10/30/12 0855  BP: 142/87  Pulse: 78  Temp: 97.3 F (36.3 C)  Resp: 20   Wt Readings from Last 3 Encounters:  10/30/12 195 lb 14.4 oz (88.86 kg)  03/13/12 186 lb 9.6 oz (84.641 kg)  02/16/12 188 lb 14.4 oz (85.684 kg)    He  looks well, seems to be in good condition from the oncological standpoint.There is no scleral icterus. Mouth and pharynx are benign.No apparent abnormalities seen on the oropharynx. The  right tonsil that looked normal. Do not see any significant radiation  changes. Dentition looks well. There is  no adenopathy in the neck or other peripheral areas. Heart/lungs: Normal. Abdomen: Benign with no organomegaly or masses palpable. Extremities: No peripheral edema or clubbing. Neurologic: Normal. The patient does not have a Port-A-Cath or central catheter.    LABORATORY/RADIOLOGY DATA:   Recent Labs Lab 10/30/12 0837  WBC 6.4  HGB 14.0  HCT 40.5  PLT 227  MCV 90.8  MCH 31.5  MCHC 34.7  RDW 13.2  LYMPHSABS 1.0  MONOABS 0.3  EOSABS 0.1  BASOSABS 0.0    CMP    Recent Labs Lab 10/30/12 0837  NA 130*  K 4.8  CL 96*  CO2 23  GLUCOSE 101*  BUN 18.8  CREATININE 1.6*  CALCIUM 9.1  AST 13  ALT 11  ALKPHOS 55  BILITOT 0.38    On 03/13/12, white count 5.5, ANC 4.1, hemoglobin 13.8, hematocrit 39.9, platelets 195,000. Chemistries were normal except for a glucose of 112. Albumin was 4.1. LDH 124. TSH was 4.890 TSH from 09/16/2011 was 3.078 as compared with 5.874 on 03/01/2011 and 5.874 on 03/02/2011.Today's TSH is pending.    Radiology Studies:  1. CT scan of the head with and without IV contrast obtained on 09/09/2009 showed normal CT appearance of the brain for age.  2. CT scan of the neck with IV contrast on 09/09/2009 showed increased sequelae of radiation therapy including small retropharyngeal effusion and diffuse pharyngeal mucosal thickening/edema. There was stable residual right neck lymph nodes. No discrete residual pharyngeal mass or lymphadenopathy compared with prior studies of 03/31/2009.  3. Chest x-ray, 2 view, from 03/29/2010 showed no active disease.  4. CT scan of the neck with IV contrast from 08/10/2010 showed no evidence for recurrent tumor or residual lymphadenopathy.  5. PET/CT scan from 02/14/2011 showed no evidence of recurrent disease. There was asymmetric hypermetabolism within the left parotid gland. Coronary artery calcifications were noted. 6. Chest X Ray on 03/16/2012 was negative for acute disease.      ASSESSMENT AND PLAN:  Mr. Leon continues to do well, now out  4 years  from the time of diagnosis in May 2010. He remains disease- free and doing well without any apparent complications related to his treatment. As stated, he is having some paresthesias in his hands that are intermittent, which may or may not be related to cisplatin. He does need evaluation with a hand specialist or a neurologist. Perhaps the best route is for the patient to arrange a full physical with his primary care physician, so that all these issues can be addressed, and he can be referred to the proper specialties . He will need a referral to neurology, followup with Dr. Jenne Pane and arrange for colonoscopy amount other medical issues that could arise, including a component of depression.   He  will return for a  follow up visit in  at which time will check CBC diff, LDH and CMET, and TSH .  The patient knows to call us in the interim if has any questions or concerns.Greater than 40 minutes had been spent face-to-face with patient.

## 2012-11-09 ENCOUNTER — Telehealth: Payer: Self-pay | Admitting: Oncology

## 2012-11-09 ENCOUNTER — Other Ambulatory Visit: Payer: Self-pay

## 2012-11-09 DIAGNOSIS — C099 Malignant neoplasm of tonsil, unspecified: Secondary | ICD-10-CM

## 2012-11-09 NOTE — Telephone Encounter (Signed)
S/w Diane, NP Coord @ [facility] re appt. Per Diane they are now on EPIC. Diane confirmed she has the referral and will call pt w/appt. No other orders.

## 2012-11-21 ENCOUNTER — Encounter: Payer: Self-pay | Admitting: Diagnostic Neuroimaging

## 2012-11-21 ENCOUNTER — Other Ambulatory Visit: Payer: Self-pay | Admitting: *Deleted

## 2012-11-21 ENCOUNTER — Ambulatory Visit (INDEPENDENT_AMBULATORY_CARE_PROVIDER_SITE_OTHER): Payer: BC Managed Care – PPO | Admitting: Diagnostic Neuroimaging

## 2012-11-21 VITALS — BP 159/97 | HR 76 | Temp 97.9°F | Ht 69.0 in | Wt 192.0 lb

## 2012-11-21 DIAGNOSIS — G609 Hereditary and idiopathic neuropathy, unspecified: Secondary | ICD-10-CM

## 2012-11-21 MED ORDER — DULOXETINE HCL 60 MG PO CPEP
60.0000 mg | ORAL_CAPSULE | Freq: Every day | ORAL | Status: DC
Start: 2012-11-21 — End: 2013-01-28

## 2012-11-21 MED ORDER — DULOXETINE HCL 30 MG PO CPEP: 30.0000 mg | ORAL_CAPSULE | Freq: Every day | ORAL | Status: DC

## 2012-11-21 NOTE — Progress Notes (Signed)
[facility]  PATIENT: Samuel Leon DOB: [birth year]  REFERRING CLINICIAN: Hepler, M HISTORY FROM: patient, wife, chart REASON FOR VISIT: pain and numbness in hands, feet, balance problems   HISTORICAL  CHIEF COMPLAINT:  Chief Complaint  Patient presents with  . Pain    hands and joints  . Other    poor balance    HISTORY OF PRESENT ILLNESS:   57 year old right-handed Caucasian male who comes in with c/o pain and numbness in hands and balance problems.  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  Around that time he developed numbness and pain i nhands and feet. He states he has has hearing loss with tinnitus after the treatments.  He has pain in the palms and fingers of both hands, with decreased sensitivity, tingling and decreased fine motor skills. He sometimes has fine motor tremor in hands but it is intermittent.  He sometimes feels shooting electrical pains down his legs when he looks down.  He has had balance problems with several falls.   REVIEW OF SYSTEMS: Full 14 system review of systems performed and notable only for fatigue hearing loss ringing in ear spinning sensation moles snoring rearrange ROMs impotence feeling cold joint pain cramps aching muscles memory loss numbness weakness insomnia sleepiness snoring depression anxiety decreased energy racing thoughts.  ALLERGIES: Allergies  Allergen Reactions  . Aspirin     HOME MEDICATIONS: Outpatient Prescriptions Prior to Visit  Medication Sig Dispense Refill  . ALPRAZolam (XANAX) 0.5 MG tablet Take 0.25 mg by mouth 4 (four) times daily as needed.       . metoprolol succinate (TOPROL-XL) 50 MG 24 hr tablet       . Multiple Vitamin (MULTIVITAMIN) tablet Take 1 tablet by mouth daily.      . temazepam (RESTORIL) 15 MG capsule Take 15 mg by mouth at bedtime as needed.      Marland Kitchen  TIROSINT 75 MCG CAPS Take 75 mcg by mouth Daily.      Marland Kitchen lisinopril (PRINIVIL,ZESTRIL) 20 MG tablet Take 20 mg by mouth Daily.       No facility-administered medications prior to visit.    PAST MEDICAL HISTORY: Past Medical History  Diagnosis Date  . Cancer     tonsillar ca  . Hypothyroid   . Weight loss   . HTN (hypertension)   . CAD (coronary artery disease)     PAST SURGICAL HISTORY: History reviewed. No pertinent past surgical history.  FAMILY HISTORY: No family history on file.  SOCIAL HISTORY:  History   Social History  . Marital Status: Married    Spouse Name: N/A    Number of Children: N/A  . Years of Education: N/A   Occupational History  . Not on file.   Social History Main Topics  . Smoking status: Former Smoker -- 0.30 packs/day for 1 years    Types: Cigarettes    Quit date: 02/16/2012  . Smokeless tobacco: Never Used  . Alcohol Use: Yes  . Drug Use: No  . Sexually Active: Not on file   Other Topics Concern  . Not on file   Social History Narrative  . No narrative on file     PHYSICAL EXAM  Filed Vitals:   11/21/12 0950  BP: 159/97  Pulse: 76  Temp: 97.9 F (36.6 C)  TempSrc: Oral  Height: 5\' 9"  (1.753 m)  Weight: 192 lb (87.091 kg)   Body mass index is 28.34 kg/(m^2).  GENERAL EXAM: Patient is in no distress, pleasant, well developed, well groomed.  CARDIOVASCULAR: Regular rate and rhythm, no murmurs, no carotid bruits  NEUROLOGIC: MENTAL STATUS: awake, alert, language fluent, comprehension intact, naming intact CRANIAL NERVE: no papilledema on fundoscopic exam, pupils equal and reactive to light, visual fields full to confrontation, extraocular muscles intact, no nystagmus, facial sensation and strength symmetric, uvula midline, shoulder shrug symmetric, tongue midline. MOTOR:  BILAT, TRICEPS 4/5, FINGER ABDUCTION 4/5, RIGHT FOOT DORSIFLEXION 3/5;  FINE HEAD TREMOR, FINE HAND POSTURAL AND ACTION TREMOR.  SENSORY: DECREASED  VIBRATION IN BILATERAL FINGER TIPS. INCREASED SENSITIVITY IN TOES TO PP. MILD GRADIENT UP TO KNEES. COORDINATION: finger-nose-finger, fine finger movements normal REFLEXES: TRACE REFLEXES IN BUE, KNEES 1+,  L ANKLE 0, R ANKLE 1+ GAIT/STATION: narrow based gait; UNSTEADY walk on toes, heels and tandem; romberg is UNSTEADY.  DIAGNOSTIC DATA (LABS, IMAGING, TESTING) - I reviewed patient records, labs, notes, testing and imaging myself where available.  Lab Results  Component Value Date   WBC 6.4 10/30/2012   HGB 14.0 10/30/2012   HCT 40.5 10/30/2012   MCV 90.8 10/30/2012   PLT 227 10/30/2012      Component Value Date/Time   NA 130* 10/30/2012 0837   NA 133* 09/06/2011 1514   K 4.8 10/30/2012 0837   K 3.7 09/06/2011 1514   CL 96* 10/30/2012 0837   CL 96 09/06/2011 1514   CO2 23 10/30/2012 0837   CO2 27 09/06/2011 1514   GLUCOSE 101* 10/30/2012 0837   GLUCOSE 105* 09/06/2011 1514   BUN 18.8 10/30/2012 0837   BUN 16 09/06/2011 1514   CREATININE 1.6* 10/30/2012 0837   CREATININE 1.29 09/06/2011 1514   CALCIUM 9.1 10/30/2012 0837   CALCIUM 9.7 09/06/2011 1514   PROT 7.6 10/30/2012 0837   PROT 7.5 09/06/2011 1514   ALBUMIN 4.1 10/30/2012 0837   ALBUMIN 4.2 09/06/2011 1514   AST 13 10/30/2012 0837   AST 17 09/06/2011 1514   ALT 11 10/30/2012 0837   ALT 12 09/06/2011 1514   ALKPHOS 55 10/30/2012 0837   ALKPHOS 53 09/06/2011 1514   BILITOT 0.38 10/30/2012 0837   BILITOT 0.4 09/06/2011 1514   No results found for this basename: CHOL,  HDL,  LDLCALC,  LDLDIRECT,  TRIG,  CHOLHDL   No results found for this basename: HGBA1C   No results found for this basename: VITAMINB12   Lab Results  Component Value Date   TSH 3.449 10/30/2012     ASSESSMENT AND PLAN  57 y.o. year old male  has a past medical history of Cancer; Hypothyroid; Weight loss; HTN (hypertension); and CAD (coronary artery disease). here with bilateral pain and numbness in hands and balance problems.  Ddx: cervical spine disease, polyradiculopathy,  neuropathy (chemo, metabolic)  PLAN: 1. MRI cervical spine 2. B12 level 3. Start cymbalta   Suanne Marker, MD (LYNN LAM NP-C 11/21/2012, 11:11 AM) Certified in Neurology, Neurophysiology and Neuroimaging  [facility] [address] [place], [place] [address] [phone]

## 2012-11-21 NOTE — Patient Instructions (Addendum)
Start Cymbalta 30 mg once daily for 7 days and then increase to 60 mg once daily.  Vitamin B12 lab today.  Schedule MRI neck at open MRI at [facility].  Return for follow up in 3-4 months.  Neuropathy Neuropathy means your peripheral nerves are not working normally. Peripheral nerves are the nerves outside the brain and spinal cord. Messages between the brain and the rest of the body do not work properly with peripheral nerve disorders. CAUSES There are many different causes of peripheral nerve disorders. These include:  Injury.   Infections.   Diabetes.   Vitamin deficiency.   Poor circulation.   Alcoholism.   Exposure to toxins.   Drug effects.   Tumors.   Kidney disease.  SYMPTOMS  Tingling, burning, pain, and numbness in the extremities.   Weakness and loss of muscle tone and size.  DIAGNOSIS Blood tests and special studies of nerve function may help confirm the diagnosis.  TREATMENT  Treatment includes adopting healthy life habits.   A good diet, vitamin supplements, and mild pain medicine may be needed.   Avoid known toxins such as alcohol, tobacco, and recreational drugs.   Anti-convulsant medicines are helpful in some types of neuropathy.  Make a follow-up appointment with your caregiver to be sure you are getting better with treatment.  SEEK IMMEDIATE MEDICAL CARE IF:   You have breathing problems.   You have severe or uncontrolled pain.   You notice extreme weakness or you feel faint.   You are not better after 1 week or if you have worse symptoms.  Document Released: 07/28/2004 Document Revised: 03/02/2011 Document Reviewed: 06/20/2005 [facility] Patient Information 2012 [place], [place].

## 2012-11-28 ENCOUNTER — Telehealth: Payer: Self-pay | Admitting: *Deleted

## 2012-11-28 NOTE — Telephone Encounter (Signed)
I called and spoke to wife and she stated that he has not had done yet.  Out of town and will have done Friday or Monday next.  FYI

## 2012-11-28 NOTE — Progress Notes (Signed)
Sent to triage pool to inquire about missing result. 

## 2012-11-29 ENCOUNTER — Other Ambulatory Visit: Payer: Self-pay | Admitting: Nurse Practitioner

## 2012-12-04 LAB — VITAMIN B12: Vitamin B-12: 447 pg/mL (ref 211–946)

## 2012-12-06 NOTE — Progress Notes (Signed)
Quick Note:  I spoke with wife and relayed normal Vitamin B12 results, per Larita Fife. ______

## 2012-12-19 ENCOUNTER — Other Ambulatory Visit: Payer: BC Managed Care – PPO

## 2012-12-26 ENCOUNTER — Ambulatory Visit (INDEPENDENT_AMBULATORY_CARE_PROVIDER_SITE_OTHER): Payer: BC Managed Care – PPO

## 2012-12-26 DIAGNOSIS — G609 Hereditary and idiopathic neuropathy, unspecified: Secondary | ICD-10-CM

## 2012-12-27 MED ORDER — GADOPENTETATE DIMEGLUMINE 469.01 MG/ML IV SOLN: 18.0000 mL | Freq: Once | INTRAVENOUS | Status: AC | PRN

## 2013-01-03 ENCOUNTER — Telehealth: Payer: Self-pay | Admitting: *Deleted

## 2013-01-03 NOTE — Telephone Encounter (Signed)
Message copied by Hermenia Fiscal on Thu Jan 03, 2013  2:23 PM ------      Message from: Arther Abbott B      Created: Wed Jan 02, 2013 10:42 AM      Contact: Pt Colburn        Pt would like for someone to call him about his MRI results, you can contact him at [phone]. ------

## 2013-01-03 NOTE — Telephone Encounter (Signed)
I called and LMVM for pt on cell, and then called home and spoke to wife.   Gave results of MRI to her.  Cymbalta working but makes him too groggy.  Questioning about balance.  I made RV 01-28-13 to go over test results and reccs. She verbalized understanding.

## 2013-01-28 ENCOUNTER — Ambulatory Visit (INDEPENDENT_AMBULATORY_CARE_PROVIDER_SITE_OTHER): Payer: BC Managed Care – PPO | Admitting: Diagnostic Neuroimaging

## 2013-01-28 ENCOUNTER — Encounter: Payer: Self-pay | Admitting: Diagnostic Neuroimaging

## 2013-01-28 VITALS — BP 154/97 | HR 78 | Temp 97.8°F | Ht 70.0 in | Wt 196.0 lb

## 2013-01-28 DIAGNOSIS — M545 Low back pain, unspecified: Secondary | ICD-10-CM

## 2013-01-28 DIAGNOSIS — G609 Hereditary and idiopathic neuropathy, unspecified: Secondary | ICD-10-CM | POA: Insufficient documentation

## 2013-01-28 DIAGNOSIS — G620 Drug-induced polyneuropathy: Secondary | ICD-10-CM | POA: Insufficient documentation

## 2013-01-28 DIAGNOSIS — T451X5A Adverse effect of antineoplastic and immunosuppressive drugs, initial encounter: Secondary | ICD-10-CM

## 2013-01-28 MED ORDER — GABAPENTIN 300 MG PO CAPS: 300.0000 mg | ORAL_CAPSULE | Freq: Two times a day (BID) | ORAL | Status: DC

## 2013-01-28 NOTE — Progress Notes (Signed)
[facility]  PATIENT: Samuel Leon DOB: 11-05-1955  REFERRING CLINICIAN: Hepler, M HISTORY FROM: patient, wife, chart REASON FOR VISIT: pain and numbness in hands, feet, balance problems   HISTORICAL  CHIEF COMPLAINT:  Chief Complaint  Patient presents with  . Follow-up    HISTORY OF PRESENT ILLNESS:   UPDATE 01/28/13: Since last visit, tried cymbalta x 1 month, but not effective. Had side effects (fatigue) and he felt "weird", so he stopped it.  Last few weeks, having more low back pain (long standing x many years, but flared up recently).   PRIOR HPI (11/21/12): 57 year old right-handed Caucasian male who comes in with c/o pain and numbness in hands and balance problems.  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  Around that time he developed numbness and pain i nhands and feet. He states he has has hearing loss with tinnitus after the treatments.  He has pain in the palms and fingers of both hands, with decreased sensitivity, tingling and decreased fine motor skills. He sometimes has fine motor tremor in hands but it is intermittent.  He sometimes feels shooting electrical pains down his legs when he looks down.  He has had balance problems with several falls.   REVIEW OF SYSTEMS: Full 14 system review of systems performed and notable only for fatigue hearing loss ringing in ear spinning sensation moles snoring rearrange ROMs impotence feeling cold joint pain cramps aching muscles memory loss numbness weakness insomnia sleepiness snoring depression anxiety decreased energy racing thoughts.  ALLERGIES: Allergies  Allergen Reactions  . Aspirin     HOME MEDICATIONS: Outpatient Prescriptions Prior to Visit  Medication Sig Dispense Refill  . amLODipine (NORVASC) 5 MG tablet       . metoprolol succinate (TOPROL-XL) 50 MG 24 hr tablet       .  Multiple Vitamin (MULTIVITAMIN) tablet Take 1 tablet by mouth daily.      . temazepam (RESTORIL) 15 MG capsule Take 15 mg by mouth at bedtime as needed.      Marland Kitchen TIROSINT 75 MCG CAPS Take 75 mcg by mouth Daily.      Marland Kitchen ALPRAZolam (XANAX) 0.5 MG tablet Take 0.25 mg by mouth 4 (four) times daily as needed.       . DULoxetine (CYMBALTA) 30 MG capsule Take 1 capsule (30 mg total) by mouth daily.  7 capsule  0  . DULoxetine (CYMBALTA) 60 MG capsule Take 1 capsule (60 mg total) by mouth daily.  30 capsule  3  . lisinopril (PRINIVIL,ZESTRIL) 20 MG tablet Take 20 mg by mouth Daily.       No facility-administered medications prior to visit.    PAST MEDICAL HISTORY: Past Medical History  Diagnosis Date  . Cancer     tonsillar ca  . Hypothyroid   . Weight loss   . HTN (hypertension)   . CAD (coronary artery disease)     PAST SURGICAL HISTORY: History reviewed. No pertinent past surgical history.  FAMILY HISTORY: Family History  Problem Relation Age of Onset  . Heart attack Mother     SOCIAL HISTORY:  History   Social History  . Marital Status: Married    Spouse Name: Sharl Ma    Number of Children: 1  . Years of Education: HS   Occupational History  .      N/a   Social History Main  Topics  . Smoking status: Former Smoker -- 0.30 packs/day for 1 years    Types: Cigarettes    Quit date: 02/16/2012  . Smokeless tobacco: Never Used  . Alcohol Use: Yes  . Drug Use: No  . Sexually Active: Not on file   Other Topics Concern  . Not on file   Social History Narrative   Patient lives at home with family.   Caffeine Use: 1-2 cups daily     PHYSICAL EXAM  Filed Vitals:   01/28/13 1126  BP: 154/97  Pulse: 78  Temp: 97.8 F (36.6 C)  TempSrc: Oral  Height: 5\' 10"  (1.778 m)  Weight: 196 lb (88.905 kg)   Body mass index is 28.12 kg/(m^2).  GENERAL EXAM: Patient is in no distress, pleasant, well developed, well groomed.  CARDIOVASCULAR: Regular rate and rhythm, no  murmurs, no carotid bruits  NEUROLOGIC: MENTAL STATUS: awake, alert, language fluent, comprehension intact, naming intact CRANIAL NERVE: no papilledema on fundoscopic exam, pupils equal and reactive to light, visual fields full to confrontation, extraocular muscles intact, no nystagmus, facial sensation and strength symmetric, uvula midline, shoulder shrug symmetric, tongue midline. MOTOR:  BUE (TRICEPS 4/5, FINGER ABDUCTION 4/5), RIGHT FOOT DORSIFLEXION 3/5;  FINE HEAD TREMOR, FINE HAND POSTURAL AND ACTION TREMOR.  SENSORY: DECREASED VIBRATION IN BILATERAL FINGER TIPS. INCREASED SENSITIVITY IN TOES TO PP. MILD GRADIENT UP TO KNEES. COORDINATION: finger-nose-finger, fine finger movements normal REFLEXES: TRACE REFLEXES IN BUE, KNEES 2,  ANKLES 1 GAIT/STATION: narrow based gait; UNSTEADY, ANTALGIC  DIAGNOSTIC DATA (LABS, IMAGING, TESTING) - I reviewed patient records, labs, notes, testing and imaging myself where available.  Lab Results  Component Value Date   WBC 6.4 10/30/2012   HGB 14.0 10/30/2012   HCT 40.5 10/30/2012   MCV 90.8 10/30/2012   PLT 227 10/30/2012      Component Value Date/Time   NA 130* 10/30/2012 0837   NA 133* 09/06/2011 1514   K 4.8 10/30/2012 0837   K 3.7 09/06/2011 1514   CL 96* 10/30/2012 0837   CL 96 09/06/2011 1514   CO2 23 10/30/2012 0837   CO2 27 09/06/2011 1514   GLUCOSE 101* 10/30/2012 0837   GLUCOSE 105* 09/06/2011 1514   BUN 18.8 10/30/2012 0837   BUN 16 09/06/2011 1514   CREATININE 1.6* 10/30/2012 0837   CREATININE 1.29 09/06/2011 1514   CALCIUM 9.1 10/30/2012 0837   CALCIUM 9.7 09/06/2011 1514   PROT 7.6 10/30/2012 0837   PROT 7.5 09/06/2011 1514   ALBUMIN 4.1 10/30/2012 0837   ALBUMIN 4.2 09/06/2011 1514   AST 13 10/30/2012 0837   AST 17 09/06/2011 1514   ALT 11 10/30/2012 0837   ALT 12 09/06/2011 1514   ALKPHOS 55 10/30/2012 0837   ALKPHOS 53 09/06/2011 1514   BILITOT 0.38 10/30/2012 0837   BILITOT 0.4 09/06/2011 1514   No results found for this basename: CHOL,  HDL,   LDLCALC,  LDLDIRECT,  TRIG,  CHOLHDL   No results found for this basename: HGBA1C   Lab Results  Component Value Date   VITAMINB12 447 11/29/2012   Lab Results  Component Value Date   TSH 3.449 10/30/2012    12/27/12 MRI cervical spine 1. At C4-5, C5-6 disc bulging and uncovertebral joint hypertrophy with mild spinal stenosis and severe bilateral foraminal stenosis 2. At C3-4 disc bulging and uncovertebral joint hypertrophy with severe bilateral foraminal stenosis 3. At C6-7 disc bulging with moderate bilateral foraminal stenosis 4. No intrinsic or enhancing spinal cord lesions  ASSESSMENT AND PLAN  57 y.o. year old male  has a past medical history of Cancer; Hypothyroid; Weight loss; HTN (hypertension); and CAD (coronary artery disease). here with bilateral pain and numbness in hands and balance problems.  Dx: chemo-neuropathy + degenerative cervical spine disease + lumbar pain   PLAN: 1. Start gabapentin 2. Consider PT and MRI lumbar spine in future; patient wants to hold off for now 3. No surgical mgmt of cervical spine at this time    Suanne Marker, MD 01/28/2013 12:43 PM Certified in Neurology, Neurophysiology and Neuroimaging  [facility] [address], [address] [place], [place] [address] [phone]

## 2013-01-28 NOTE — Patient Instructions (Signed)
Try gabapentin 300mg  at bedtime; can increase to twice a day.  Physical therapy vs gym/trainer exercises.

## 2013-04-30 ENCOUNTER — Ambulatory Visit (HOSPITAL_BASED_OUTPATIENT_CLINIC_OR_DEPARTMENT_OTHER): Payer: BC Managed Care – PPO | Admitting: Internal Medicine

## 2013-04-30 ENCOUNTER — Telehealth: Payer: Self-pay | Admitting: Internal Medicine

## 2013-04-30 ENCOUNTER — Other Ambulatory Visit (HOSPITAL_BASED_OUTPATIENT_CLINIC_OR_DEPARTMENT_OTHER): Payer: BC Managed Care – PPO | Admitting: Lab

## 2013-04-30 ENCOUNTER — Other Ambulatory Visit: Payer: Self-pay | Admitting: Internal Medicine

## 2013-04-30 ENCOUNTER — Encounter (INDEPENDENT_AMBULATORY_CARE_PROVIDER_SITE_OTHER): Payer: Self-pay

## 2013-04-30 VITALS — BP 166/95 | HR 79 | Temp 97.6°F | Resp 20 | Ht 70.0 in | Wt 191.7 lb

## 2013-04-30 DIAGNOSIS — C099 Malignant neoplasm of tonsil, unspecified: Secondary | ICD-10-CM

## 2013-04-30 DIAGNOSIS — M545 Low back pain, unspecified: Secondary | ICD-10-CM

## 2013-04-30 DIAGNOSIS — E039 Hypothyroidism, unspecified: Secondary | ICD-10-CM

## 2013-04-30 DIAGNOSIS — B977 Papillomavirus as the cause of diseases classified elsewhere: Secondary | ICD-10-CM

## 2013-04-30 DIAGNOSIS — G620 Drug-induced polyneuropathy: Secondary | ICD-10-CM

## 2013-04-30 LAB — CBC WITH DIFFERENTIAL/PLATELET
Basophils Absolute: 0 10*3/uL (ref 0.0–0.1)
Eosinophils Absolute: 0.1 10*3/uL (ref 0.0–0.5)
HGB: 14.9 g/dL (ref 13.0–17.1)
MCH: 31.8 pg (ref 27.2–33.4)
MCHC: 34.9 g/dL (ref 32.0–36.0)
MONO#: 0.3 10*3/uL (ref 0.1–0.9)
NEUT#: 5.4 10*3/uL (ref 1.5–6.5)
Platelets: 217 10*3/uL (ref 140–400)
RBC: 4.71 10*6/uL (ref 4.20–5.82)
RDW: 13 % (ref 11.0–14.6)
WBC: 6.9 10*3/uL (ref 4.0–10.3)
lymph#: 1.1 10*3/uL (ref 0.9–3.3)

## 2013-04-30 LAB — COMPREHENSIVE METABOLIC PANEL (CC13)
Albumin: 4 g/dL (ref 3.5–5.0)
Anion Gap: 10 mEq/L (ref 3–11)
BUN: 12.1 mg/dL (ref 7.0–26.0)
Calcium: 9.4 mg/dL (ref 8.4–10.4)
Chloride: 106 mEq/L (ref 98–109)
Glucose: 108 mg/dl (ref 70–140)
Potassium: 4.1 mEq/L (ref 3.5–5.1)
Sodium: 139 mEq/L (ref 136–145)
Total Protein: 7.5 g/dL (ref 6.4–8.3)

## 2013-04-30 LAB — TSH CHCC: TSH: 2.907 m(IU)/L (ref 0.320–4.118)

## 2013-04-30 NOTE — Telephone Encounter (Signed)
gv and printed appt sched for pt for April 2015

## 2013-05-01 NOTE — Progress Notes (Signed)
[facility] OFFICE PROGRESS NOTE  [name], PA-C [address] [place] [address]  DIAGNOSIS: Chemotherapy-induced neuropathy - Plan: CBC with Differential, Comprehensive metabolic panel  Cancer of tonsil - Plan: CBC with Differential, Comprehensive metabolic panel  Low back pain  Chief Complaint  Patient presents with  . Cancer of tonsil    CURRENT THERAPY:  Observation.    INTERVAL HISTORY: Samuel Leon 57 y.o. 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  He was HPV positive.  He received 2 doses of cisplatin 100 mg/m2 on 01/12/2009 and 02/02/2009 while at the same time receiving radiation IMRT 70 Gy in 35 fractions from 01/12/2009 through 03/03/2009.   He has been disease free since that time.  He was last seen by [place] on 10/30/2012.    Today, he is accompanied by his wife Sharl Ma.  In general, he is doing very well, fully active, withoutany symptoms to suggest recurrent head and neck cancer.  He complains of intermittent neuropathy of both hands tingling at times becoming painful so that he loses his grip.  This has worsened over the past several months.  He was evaluated by neurology (Dr. Marjory Lies) who recommended a trial of gabapentin which he completed without relief.   MEDICAL HISTORY: Past Medical History  Diagnosis Date  . Cancer     tonsillar ca  . Hypothyroid   . Weight loss   . HTN (hypertension)   . CAD (coronary artery disease)     INTERIM HISTORY: has Cancer of tonsil; Unspecified hereditary and idiopathic peripheral neuropathy; Chemotherapy-induced neuropathy; and Low back pain on his problem list.    ALLERGIES:  is allergic to aspirin.  MEDICATIONS: has a current medication list which includes the following prescription(s): alprazolam, amlodipine, metoprolol succinate, multivitamin, temazepam, and tirosint.  SURGICAL HISTORY: History  reviewed. No pertinent past surgical history.  PROBLEM LIST:  1. Squamous cell carcinoma of the right tonsil, T1 N2b, IVA diagnosed by biopsy on 11/19/2008. 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 The patient received treatments under the direction of  Dr. Jethro Bolus and Dr. [name]. He received 2 doses of cisplatin 100 mg/m2 (equal to 210 mg) on 01/12/2009 and 02/02/2009 while at the same time receiving radiation IMRT 70 Gy in 35  fractions from 01/12/2009 through 03/03/2009. The patient has remained disease-free since that time.  2. Hypertension.  3. Hypothyroidism.  4. Tinnitus.  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  6. Coronary artery disease by CT scan. The patient underwent an exercise stress test in the spring of 2013, which showed no evidence for ischemia, with adequate exercise tolerance.  REVIEW OF SYSTEMS:   Constitutional: Denies fevers, chills or abnormal weight loss Eyes: Denies blurriness of vision Ears, nose, mouth, throat, and face: Denies mucositis or sore throat Respiratory: Denies cough, dyspnea or wheezes Cardiovascular: Denies palpitation, chest discomfort or lower extremity swelling Gastrointestinal:  Denies nausea, heartburn or change in bowel habits Skin: Denies abnormal skin rashes Lymphatics: Denies new lymphadenopathy or easy bruising Neurological:Denies numbness, tingling or new weaknesses Behavioral/Psych: Mood is stable, no new changes  All other systems were reviewed with the patient and are negative.  PHYSICAL EXAMINATION: ECOG PERFORMANCE STATUS: 0 - Asymptomatic  Blood pressure 166/95, pulse 79, temperature 97.6 F (36.4 C), temperature source Oral, resp. rate 20, height 5\' 10"  (1.778 m), weight 191 lb 11.2 oz (86.955 kg).  GENERAL:alert, no distress  and comfortable SKIN: skin color, texture, turgor are normal, no rashes or significant lesions EYES: normal, Conjunctiva are pink and non-injected, sclera  clear OROPHARYNX:no exudate, no erythema and lips, buccal mucosa, and tongue normal  NECK: supple, thyroid normal size, non-tender, without nodularity LYMPH:  no palpable lymphadenopathy in the cervical, axillary or supraclavicular LUNGS: clear to auscultation and percussion with normal breathing effort HEART: regular rate & rhythm and no murmurs and no lower extremity edema ABDOMEN:abdomen soft, non-tender and normal bowel sounds Musculoskeletal:no cyanosis of digits and no clubbing  NEURO: alert & oriented x 3 with fluent speech, no focal motor/sensory deficits   LABORATORY DATA: Results for orders placed in visit on 04/30/13 (from the past 48 hour(s))  TSH [facility]     Status: None   Collection Time    04/30/13 10:32 AM      Result Value Range   TSH 2.907  0.320 - 4.118 m(IU)/L  CBC WITH DIFFERENTIAL     Status: Abnormal   Collection Time    04/30/13 10:32 AM      Result Value Range   WBC 6.9  4.0 - 10.3 10e3/uL   NEUT# 5.4  1.5 - 6.5 10e3/uL   HGB 14.9  13.0 - 17.1 g/dL   HCT 11.9  14.7 - 82.9 %   Platelets 217  140 - 400 10e3/uL   MCV 91.0  79.3 - 98.0 fL   MCH 31.8  27.2 - 33.4 pg   MCHC 34.9  32.0 - 36.0 g/dL   RBC 5.62  1.30 - 8.65 10e6/uL   RDW 13.0  11.0 - 14.6 %   lymph# 1.1  0.9 - 3.3 10e3/uL   MONO# 0.3  0.1 - 0.9 10e3/uL   Eosinophils Absolute 0.1  0.0 - 0.5 10e3/uL   Basophils Absolute 0.0  0.0 - 0.1 10e3/uL   NEUT% 78.3 (*) 39.0 - 75.0 %   LYMPH% 16.3  14.0 - 49.0 %   MONO% 3.9  0.0 - 14.0 %   EOS% 0.8  0.0 - 7.0 %   BASO% 0.7  0.0 - 2.0 %  COMPREHENSIVE METABOLIC PANEL ([facility]13)     Status: None   Collection Time    04/30/13 10:32 AM      Result Value Range   Sodium 139  136 - 145 mEq/L   Potassium 4.1  3.5 - 5.1 mEq/L   Chloride 106  98 - 109 mEq/L   CO2 23  22 - 29 mEq/L   Glucose 108  70 - 140 mg/dl   BUN 78.4  7.0 - 69.6 mg/dL   Creatinine 1.2  0.7 - 1.3 mg/dL   Total Bilirubin 2.95  0.20 - 1.20 mg/dL   Alkaline Phosphatase 54  40 - 150 U/L   AST 14   5 - 34 U/L   ALT 13  0 - 55 U/L   Total Protein 7.5  6.4 - 8.3 g/dL   Albumin 4.0  3.5 - 5.0 g/dL   Calcium 9.4  8.4 - 28.4 mg/dL   Anion Gap 10  3 - 11 mEq/L    Labs:  Lab Results  Component Value Date   WBC 6.9 04/30/2013   HGB 14.9 04/30/2013   HCT 42.8 04/30/2013   MCV 91.0 04/30/2013   PLT 217 04/30/2013   NEUTROABS 5.4 04/30/2013      Chemistry      Component Value Date/Time   NA 139 04/30/2013 1032   NA 133* 09/06/2011 1514   K 4.1  04/30/2013 1032   K 3.7 09/06/2011 1514   CL 96* 10/30/2012 0837   CL 96 09/06/2011 1514   CO2 23 04/30/2013 1032   CO2 27 09/06/2011 1514   BUN 12.1 04/30/2013 1032   BUN 16 09/06/2011 1514   CREATININE 1.2 04/30/2013 1032   CREATININE 1.29 09/06/2011 1514      Component Value Date/Time   CALCIUM 9.4 04/30/2013 1032   CALCIUM 9.7 09/06/2011 1514   ALKPHOS 54 04/30/2013 1032   ALKPHOS 53 09/06/2011 1514   AST 14 04/30/2013 1032   AST 17 09/06/2011 1514   ALT 13 04/30/2013 1032   ALT 12 09/06/2011 1514   BILITOT 0.60 04/30/2013 1032   BILITOT 0.4 09/06/2011 1514     Basic Metabolic Panel:  Recent Labs Lab 04/30/13 1032  NA 139  K 4.1  CO2 23  GLUCOSE 108  BUN 12.1  CREATININE 1.2  CALCIUM 9.4   GFR Estimated Creatinine Clearance: 71 ml/min (by C-G formula based on Cr of 1.2). Liver Function Tests:  Recent Labs Lab 04/30/13 1032  AST 14  ALT 13  ALKPHOS 54  BILITOT 0.60  PROT 7.5  ALBUMIN 4.0   CBC:  Recent Labs Lab 04/30/13 1032  WBC 6.9  NEUTROABS 5.4  HGB 14.9  HCT 42.8  MCV 91.0  PLT 217   Thyroid function studies  Recent Labs  04/30/13 1032  TSH 2.907   RADIOGRAPHIC STUDIES: MR Cervical Spine (12/27/2012) IMPRESSION:  Abnormal MRI cervical spine (with and without) demonstrating: 1. At C4-5, C5-6 disc bulging and uncovertebral joint hypertrophy with mild spinal stenosis and severe bilateral foraminal stenosis 2. At C3-4 disc bulging and uncovertebral joint hypertrophy with severe bilateral foraminal  stenosis 3. At C6-7 disc bulging with moderate bilateral foraminal stenosis 4. No intrinsic or enhancing spinal cord lesions  ASSESSMENT: Samuel Leon 57 y.o. male with a history of Chemotherapy-induced neuropathy - Plan: CBC with Differential, Comprehensive metabolic panel  Cancer of tonsil - Plan: CBC with Differential, Comprehensive metabolic panel  Low back pain   PLAN:  1. Cancer of tonsil. --Mr. Shepherd continues to do well, now out almost 4-  1/2 years from the time of diagnosis in May 2010. He remains disease-free and doing well without any apparent complications related to his treatment.   2. Chemotherapy-induced neuropathy. --Not sure it is entirely related to his chemotherapy given his recent MRI of the neck demonstrated multiple structural abnormalities including at C4-5, C5-6 disc bulging and uncovertebral joint hypertrophy with mild spinal stenosis and severe bilateral foraminal stenosis, and at C3-4 disc bulging and uncovertebral joint hypertrophy with severe bilateral foraminal stenosis, and at C6-7 disc bulging with moderate bilateral foraminal stenosis. He will follow-up with neurology for further recommendations and management.  He agreed to call an arrange appropriate follow-up.   3. Hypothyroidism.  --TSH, WNL. Continue current synthroid dose.    4. Follow-up. --I have asked the patient to return in 6 months, at which time we will check CBC, chemistries and TSH   All questions were answered. The patient knows to call the clinic with any problems, questions or concerns. We can certainly see the patient much sooner if necessary.  I spent 15 minutes counseling the patient face to face. The total time spent in the appointment was 25 minutes.    Kimi Bordeau, MD 05/01/2013 5:01 PM

## 2013-05-17 ENCOUNTER — Telehealth: Payer: Self-pay

## 2013-05-17 NOTE — Telephone Encounter (Signed)
Pt lvm asking [place] to fax lab and OV to [employer] [facility] with [facility]. Fax done.

## 2013-07-31 ENCOUNTER — Ambulatory Visit: Payer: BC Managed Care – PPO | Admitting: Diagnostic Neuroimaging

## 2013-08-21 ENCOUNTER — Ambulatory Visit (INDEPENDENT_AMBULATORY_CARE_PROVIDER_SITE_OTHER): Payer: BC Managed Care – PPO | Admitting: Diagnostic Neuroimaging

## 2013-08-21 ENCOUNTER — Encounter (INDEPENDENT_AMBULATORY_CARE_PROVIDER_SITE_OTHER): Payer: Self-pay

## 2013-08-21 ENCOUNTER — Encounter: Payer: Self-pay | Admitting: Diagnostic Neuroimaging

## 2013-08-21 VITALS — BP 139/84 | HR 81 | Ht 70.0 in | Wt 199.0 lb

## 2013-08-21 DIAGNOSIS — G620 Drug-induced polyneuropathy: Secondary | ICD-10-CM

## 2013-08-21 DIAGNOSIS — T451X5A Adverse effect of antineoplastic and immunosuppressive drugs, initial encounter: Principal | ICD-10-CM

## 2013-08-21 MED ORDER — GABAPENTIN 300 MG PO CAPS: 600.0000 mg | ORAL_CAPSULE | Freq: Three times a day (TID) | ORAL | Status: DC

## 2013-08-21 NOTE — Patient Instructions (Signed)
I ordered neuropathy cream.  Restart gabapentin 300mg  caps (1 tab twice a day, then gradually increase up to 2 tabs three times per day).  Try physical therapy.

## 2013-08-21 NOTE — Progress Notes (Signed)
[facility]  PATIENT: Samuel Leon DOB: [birth year]  REFERRING CLINICIAN:  HISTORY FROM: patient, wife, sister-in-law REASON FOR VISIT: follow up   HISTORICAL  CHIEF COMPLAINT:  Chief Complaint  Patient presents with  . Follow-up    neuropathy    HISTORY OF PRESENT ILLNESS:   UPDATE 08/21/13: Since last visit, tried gabapentin x 2 months, but not effective so he stopped it (tried gabapentin 300mg  BID). Still with burning, prickly pain, in hands. Balance is poor. Has been falling more.  UPDATE 01/28/13: Since last visit, tried cymbalta x 1 month, but not effective. Had side effects (fatigue) and he felt "weird", so he stopped it.  Last few weeks, having more low back pain (long standing x many years, but flared up recently).   PRIOR HPI (11/21/12): 58 year old right-handed Caucasian male who comes in with c/o pain and numbness in hands and balance problems.  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  Around that time he developed numbness and pain i nhands and feet. He states he has has hearing loss with tinnitus after the treatments.  He has pain in the palms and fingers of both hands, with decreased sensitivity, tingling and decreased fine motor skills. He sometimes has fine motor tremor in hands but it is intermittent.  He sometimes feels shooting electrical pains down his legs when he looks down.  He has had balance problems with several falls.   REVIEW OF SYSTEMS: Full 14 system review of systems performed and notable only for ear pain, shortness of breath, poor sleep, anxiety.   ALLERGIES: Allergies  Allergen Reactions  . Aspirin     HOME MEDICATIONS: Outpatient Prescriptions Prior to Visit  Medication Sig Dispense Refill  . ALPRAZolam (XANAX) 0.5 MG tablet Take 0.25 mg by mouth 4 (four) times daily as needed.       Marland Kitchen amLODipine  (NORVASC) 5 MG tablet       . metoprolol succinate (TOPROL-XL) 50 MG 24 hr tablet       . Multiple Vitamin (MULTIVITAMIN) tablet Take 1 tablet by mouth daily.      . temazepam (RESTORIL) 15 MG capsule Take 15 mg by mouth at bedtime as needed.      Marland Kitchen TIROSINT 75 MCG CAPS Take 75 mcg by mouth Daily.       No facility-administered medications prior to visit.    PAST MEDICAL HISTORY: Past Medical History  Diagnosis Date  . Cancer     tonsillar ca  . Hypothyroid   . Weight loss   . HTN (hypertension)   . CAD (coronary artery disease)     PAST SURGICAL HISTORY: History reviewed. No pertinent past surgical history.  FAMILY HISTORY: Family History  Problem Relation Age of Onset  . Heart attack Mother     SOCIAL HISTORY:  History   Social History  . Marital Status: Married    Spouse Name: Jerrye Beavers    Number of Children: 1  . Years of Education: HS   Occupational History  .      N/a   Social History Main Topics  . Smoking status: Former Smoker -- 0.30 packs/day for 1 years    Types: Cigarettes    Quit date: 02/16/2012  . Smokeless tobacco: Never Used  . Alcohol Use: Yes  . Drug Use: No  . Sexual Activity: Not on file  Other Topics Concern  . Not on file   Social History Narrative   Patient lives at home with family.   Caffeine Use: 1-2 cups daily     PHYSICAL EXAM  Filed Vitals:   08/21/13 1343  BP: 139/84  Pulse: 81  Height: 5\' 10"  (1.778 m)  Weight: 199 lb (90.266 kg)   Body mass index is 28.55 kg/(m^2).  GENERAL EXAM: Patient is in no distress, pleasant, well developed, well groomed.  CARDIOVASCULAR: Regular rate and rhythm, no murmurs, no carotid bruits  NEUROLOGIC: MENTAL STATUS: awake, alert, language fluent, comprehension intact, naming intact CRANIAL NERVE: no papilledema on fundoscopic exam, pupils equal and reactive to light, visual fields full to confrontation, extraocular muscles intact, no nystagmus, facial sensation and strength  symmetric, uvula midline, shoulder shrug symmetric, tongue midline. MOTOR:  BUE (TRICEPS 4/5, FINGER ABDUCTION 3/5), RIGHT FOOT DORSIFLEXION 3/5;  FINE HEAD TREMOR, FINE HAND POSTURAL AND ACTION TREMOR.  SENSORY: DECREASED VIBRATION IN BILATERAL FINGER TIPS. INCREASED SENSITIVITY IN TOES TO PP. MILD GRADIENT UP TO KNEES. COORDINATION: finger-nose-finger, fine finger movements normal REFLEXES: TRACE REFLEXES IN BUE, KNEES 2,  ANKLES 1 GAIT/STATION: narrow based gait; UNSTEADY, ANTALGIC  DIAGNOSTIC DATA (LABS, IMAGING, TESTING) - I reviewed patient records, labs, notes, testing and imaging myself where available.  Lab Results  Component Value Date   WBC 6.9 04/30/2013   HGB 14.9 04/30/2013   HCT 42.8 04/30/2013   MCV 91.0 04/30/2013   PLT 217 04/30/2013      Component Value Date/Time   NA 139 04/30/2013 1032   NA 133* 09/06/2011 1514   K 4.1 04/30/2013 1032   K 3.7 09/06/2011 1514   CL 96* 10/30/2012 0837   CL 96 09/06/2011 1514   CO2 23 04/30/2013 1032   CO2 27 09/06/2011 1514   GLUCOSE 108 04/30/2013 1032   GLUCOSE 101* 10/30/2012 0837   GLUCOSE 105* 09/06/2011 1514   BUN 12.1 04/30/2013 1032   BUN 16 09/06/2011 1514   CREATININE 1.2 04/30/2013 1032   CREATININE 1.29 09/06/2011 1514   CALCIUM 9.4 04/30/2013 1032   CALCIUM 9.7 09/06/2011 1514   PROT 7.5 04/30/2013 1032   PROT 7.5 09/06/2011 1514   ALBUMIN 4.0 04/30/2013 1032   ALBUMIN 4.2 09/06/2011 1514   AST 14 04/30/2013 1032   AST 17 09/06/2011 1514   ALT 13 04/30/2013 1032   ALT 12 09/06/2011 1514   ALKPHOS 54 04/30/2013 1032   ALKPHOS 53 09/06/2011 1514   BILITOT 0.60 04/30/2013 1032   BILITOT 0.4 09/06/2011 1514   No results found for this basename: CHOL,  HDL,  LDLCALC,  LDLDIRECT,  TRIG,  CHOLHDL   No results found for this basename: HGBA1C   Lab Results  Component Value Date   VITAMINB12 447 11/29/2012   Lab Results  Component Value Date   TSH 2.907 04/30/2013    12/27/12 MRI cervical spine 1. At C4-5, C5-6 disc bulging and  uncovertebral joint hypertrophy with mild spinal stenosis and severe bilateral foraminal stenosis 2. At C3-4 disc bulging and uncovertebral joint hypertrophy with severe bilateral foraminal stenosis 3. At C6-7 disc bulging with moderate bilateral foraminal stenosis 4. No intrinsic or enhancing spinal cord lesions   ASSESSMENT AND PLAN  58 y.o. year old male  has a past medical history of Cancer; Hypothyroid; Weight loss; HTN (hypertension); and CAD (coronary artery disease). here with bilateral pain and numbness in hands and balance problems.  Dx: chemo-neuropathy + degenerative cervical spine disease + lumbar pain  PLAN: 1. Restart gabapentin; this time try to titrate to higher dose  2. Neuropathy cream 3. PT evalaution 4. No surgical mgmt of cervical spine at this time  Return in about 6 months (around 02/18/2014).    Penni Bombard, MD 10/19/4079 4:48 PM Certified in Neurology, Neurophysiology and Neuroimaging  [facility] [address] [place], [place] [address] [phone]

## 2013-10-29 ENCOUNTER — Other Ambulatory Visit (HOSPITAL_BASED_OUTPATIENT_CLINIC_OR_DEPARTMENT_OTHER): Payer: BC Managed Care – PPO

## 2013-10-29 ENCOUNTER — Telehealth: Payer: Self-pay | Admitting: Internal Medicine

## 2013-10-29 ENCOUNTER — Encounter: Payer: Self-pay | Admitting: Internal Medicine

## 2013-10-29 ENCOUNTER — Ambulatory Visit (HOSPITAL_BASED_OUTPATIENT_CLINIC_OR_DEPARTMENT_OTHER): Payer: BC Managed Care – PPO | Admitting: Internal Medicine

## 2013-10-29 VITALS — BP 165/106 | HR 90 | Temp 98.3°F | Resp 20 | Ht 70.0 in | Wt 198.6 lb

## 2013-10-29 DIAGNOSIS — E039 Hypothyroidism, unspecified: Secondary | ICD-10-CM

## 2013-10-29 DIAGNOSIS — B977 Papillomavirus as the cause of diseases classified elsewhere: Secondary | ICD-10-CM

## 2013-10-29 DIAGNOSIS — G620 Drug-induced polyneuropathy: Secondary | ICD-10-CM

## 2013-10-29 DIAGNOSIS — T451X5A Adverse effect of antineoplastic and immunosuppressive drugs, initial encounter: Principal | ICD-10-CM

## 2013-10-29 DIAGNOSIS — C099 Malignant neoplasm of tonsil, unspecified: Secondary | ICD-10-CM

## 2013-10-29 DIAGNOSIS — C01 Malignant neoplasm of base of tongue: Secondary | ICD-10-CM

## 2013-10-29 LAB — COMPREHENSIVE METABOLIC PANEL (CC13)
ALT: 13 U/L (ref 0–55)
AST: 15 U/L (ref 5–34)
Albumin: 4 g/dL (ref 3.5–5.0)
Alkaline Phosphatase: 66 U/L (ref 40–150)
Anion Gap: 10 mEq/L (ref 3–11)
BUN: 11.7 mg/dL (ref 7.0–26.0)
CALCIUM: 9.7 mg/dL (ref 8.4–10.4)
CHLORIDE: 105 meq/L (ref 98–109)
CO2: 23 mEq/L (ref 22–29)
Creatinine: 1.3 mg/dL (ref 0.7–1.3)
Glucose: 106 mg/dl (ref 70–140)
Potassium: 4.6 mEq/L (ref 3.5–5.1)
Sodium: 138 mEq/L (ref 136–145)
Total Bilirubin: 0.48 mg/dL (ref 0.20–1.20)
Total Protein: 7.7 g/dL (ref 6.4–8.3)

## 2013-10-29 LAB — CBC WITH DIFFERENTIAL/PLATELET
BASO%: 0.6 % (ref 0.0–2.0)
BASOS ABS: 0 10*3/uL (ref 0.0–0.1)
EOS%: 2.4 % (ref 0.0–7.0)
Eosinophils Absolute: 0.1 10*3/uL (ref 0.0–0.5)
HEMATOCRIT: 42.2 % (ref 38.4–49.9)
HEMOGLOBIN: 14.4 g/dL (ref 13.0–17.1)
LYMPH#: 1.1 10*3/uL (ref 0.9–3.3)
LYMPH%: 22.2 % (ref 14.0–49.0)
MCH: 31 pg (ref 27.2–33.4)
MCHC: 34.1 g/dL (ref 32.0–36.0)
MCV: 90.8 fL (ref 79.3–98.0)
MONO#: 0.3 10*3/uL (ref 0.1–0.9)
MONO%: 6.3 % (ref 0.0–14.0)
NEUT#: 3.4 10*3/uL (ref 1.5–6.5)
NEUT%: 68.5 % (ref 39.0–75.0)
PLATELETS: 183 10*3/uL (ref 140–400)
RBC: 4.65 10*6/uL (ref 4.20–5.82)
RDW: 12.7 % (ref 11.0–14.6)
WBC: 4.9 10*3/uL (ref 4.0–10.3)

## 2013-10-29 NOTE — Telephone Encounter (Signed)
gv adn printed appt sched and avs for pt for OCT °

## 2013-10-29 NOTE — Progress Notes (Signed)
[facility] OFFICE PROGRESS NOTE  [name], PA-C [address] [place] [address]  DIAGNOSIS: Cancer of tonsil - Plan: CBC with Differential, Comprehensive metabolic panel (Cmet) - [facility], TSH  Chemotherapy-induced neuropathy  Chief Complaint  Patient presents with  . Chemotherapy-induced neuropathy    CURRENT THERAPY:  Observation.    INTERVAL HISTORY: Samuel Leon 58 y.o. 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  He was HPV positive.  He received 2 doses of cisplatin 100 mg/m2 on 01/12/2009 and 02/02/2009 while at the same time receiving radiation IMRT 70 Gy in 35 fractions from 01/12/2009 through 03/03/2009.   He has been disease free since that time.  He was last seen by [place] on 10/30/2012.    In general, he is doing very well, fully active, without any symptoms to suggest recurrent head and neck cancer.  On Sunday morning, his right shoulder blade area had some cramping and discomfort upon awakening.  He has taken some naprosyn with moderate improvement.  He complains of intermittent neuropathy of both hands tingling at times becoming painful so that he loses his grip.  This is the same.  Every now then, he has tingling in his feet but this is intermittent.  He was evaluated by neurology (Dr. Leta Baptist) who recommended a trial of gabapentin which he completed without relief. He is on 600 mg bid.  He was also given a pain cream.    MEDICAL HISTORY: Past Medical History  Diagnosis Date  . Cancer     tonsillar ca  . Hypothyroid   . Weight loss   . HTN (hypertension)   . CAD (coronary artery disease)     INTERIM HISTORY: has Cancer of tonsil; Unspecified hereditary and idiopathic peripheral neuropathy; Chemotherapy-induced neuropathy; and Low back pain on his problem list.    ALLERGIES:  is allergic to aspirin.  MEDICATIONS: has a current medication list  which includes the following prescription(s): alprazolam, amlodipine, gabapentin, metoprolol succinate, multivitamin, temazepam, and tirosint.  SURGICAL HISTORY: No past surgical history on file.  PROBLEM LIST:  1. Squamous cell carcinoma of the right tonsil, T1 N2b, IVA diagnosed by biopsy on 11/19/2008. 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 The patient received treatments under the direction of  Dr. Sherryl Manges and Dr. Thea Silversmith. He received 2 doses of cisplatin 100 mg/m2 (equal to 210 mg) on 01/12/2009 and 02/02/2009 while at the same time receiving radiation IMRT 70 Gy in 35  fractions from 01/12/2009 through 03/03/2009. The patient has remained disease-free since that time.  2. Hypertension.  3. Hypothyroidism.  4. Tinnitus.  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  6. Coronary artery disease by CT scan. The patient underwent an exercise stress test in the spring of 2013, which showed no evidence for ischemia, with adequate exercise tolerance.  REVIEW OF SYSTEMS:   Constitutional: Denies fevers, chills or abnormal weight loss Eyes: Denies blurriness of vision Ears, nose, mouth, throat, and face: Denies mucositis or sore throat Respiratory: Denies cough, dyspnea or wheezes Cardiovascular: Denies palpitation, chest discomfort or lower extremity swelling Gastrointestinal:  Denies nausea, heartburn or change in bowel habits Skin: Denies abnormal skin rashes Lymphatics: Denies new lymphadenopathy or easy bruising Neurological:Denies numbness, tingling or new weaknesses Behavioral/Psych: Mood is stable, no new changes  All other systems were reviewed with the patient and are negative.  PHYSICAL EXAMINATION: ECOG PERFORMANCE STATUS: 0 - Asymptomatic  Blood pressure 165/106, pulse 90, temperature 98.3 F (36.8 C), temperature source Oral, resp. rate 20, height 5\' 10"  (1.778 m), weight 198 lb 9.6 oz (90.084 kg), SpO2 100.00%.  GENERAL:alert, no distress and  comfortable; well developed and well nourished.  SKIN: skin color, texture, turgor are normal, no rashes or significant lesions EYES: normal, Conjunctiva are pink and non-injected, sclera clear OROPHARYNX:no exudate, no erythema and lips, buccal mucosa, and tongue normal  NECK: supple, thyroid normal size, non-tender, without nodularity LYMPH:  no palpable lymphadenopathy in the cervical, axillary or supraclavicular LUNGS: clear to auscultation and percussion with normal breathing effort HEART: regular rate & rhythm and no murmurs and no lower extremity edema ABDOMEN:abdomen soft, non-tender and normal bowel sounds Musculoskeletal:no cyanosis of digits and no clubbing  NEURO: alert & oriented x 3 with fluent speech, no focal motor/sensory deficits   LABORATORY DATA: Results for orders placed in visit on 10/29/13 (from the past 48 hour(s))  CBC WITH DIFFERENTIAL     Status: None   Collection Time    10/29/13 10:09 AM      Result Value Ref Range   WBC 4.9  4.0 - 10.3 10e3/uL   NEUT# 3.4  1.5 - 6.5 10e3/uL   HGB 14.4  13.0 - 17.1 g/dL   HCT 42.2  38.4 - 49.9 %   Platelets 183  140 - 400 10e3/uL   MCV 90.8  79.3 - 98.0 fL   MCH 31.0  27.2 - 33.4 pg   MCHC 34.1  32.0 - 36.0 g/dL   RBC 4.65  4.20 - 5.82 10e6/uL   RDW 12.7  11.0 - 14.6 %   lymph# 1.1  0.9 - 3.3 10e3/uL   MONO# 0.3  0.1 - 0.9 10e3/uL   Eosinophils Absolute 0.1  0.0 - 0.5 10e3/uL   Basophils Absolute 0.0  0.0 - 0.1 10e3/uL   NEUT% 68.5  39.0 - 75.0 %   LYMPH% 22.2  14.0 - 49.0 %   MONO% 6.3  0.0 - 14.0 %   EOS% 2.4  0.0 - 7.0 %   BASO% 0.6  0.0 - 2.0 %  COMPREHENSIVE METABOLIC PANEL (QP61)     Status: None   Collection Time    10/29/13 10:09 AM      Result Value Ref Range   Sodium 138  136 - 145 mEq/L   Potassium 4.6  3.5 - 5.1 mEq/L   Chloride 105  98 - 109 mEq/L   CO2 23  22 - 29 mEq/L   Glucose 106  70 - 140 mg/dl   BUN 11.7  7.0 - 26.0 mg/dL   Creatinine 1.3  0.7 - 1.3 mg/dL   Total Bilirubin 0.48  0.20 -  1.20 mg/dL   Alkaline Phosphatase 66  40 - 150 U/L   AST 15  5 - 34 U/L   ALT 13  0 - 55 U/L   Total Protein 7.7  6.4 - 8.3 g/dL   Albumin 4.0  3.5 - 5.0 g/dL   Calcium 9.7  8.4 - 10.4 mg/dL   Anion Gap 10  3 - 11 mEq/L    Labs:  Lab Results  Component Value Date   WBC 4.9 10/29/2013   HGB 14.4 10/29/2013   HCT 42.2 10/29/2013   MCV 90.8 10/29/2013   PLT 183 10/29/2013   NEUTROABS 3.4 10/29/2013      Chemistry      Component Value Date/Time   NA 138 10/29/2013 1009   NA 133*  09/06/2011 1514   K 4.6 10/29/2013 1009   K 3.7 09/06/2011 1514   CL 96* 10/30/2012 0837   CL 96 09/06/2011 1514   CO2 23 10/29/2013 1009   CO2 27 09/06/2011 1514   BUN 11.7 10/29/2013 1009   BUN 16 09/06/2011 1514   CREATININE 1.3 10/29/2013 1009   CREATININE 1.29 09/06/2011 1514      Component Value Date/Time   CALCIUM 9.7 10/29/2013 1009   CALCIUM 9.7 09/06/2011 1514   ALKPHOS 66 10/29/2013 1009   ALKPHOS 53 09/06/2011 1514   AST 15 10/29/2013 1009   AST 17 09/06/2011 1514   ALT 13 10/29/2013 1009   ALT 12 09/06/2011 1514   BILITOT 0.48 10/29/2013 1009   BILITOT 0.4 09/06/2011 1514     Basic Metabolic Panel:  Recent Labs Lab 10/29/13 1009  NA 138  K 4.6  CO2 23  GLUCOSE 106  BUN 11.7  CREATININE 1.3  CALCIUM 9.7   GFR Estimated Creatinine Clearance: 71.6 ml/min (by C-G formula based on Cr of 1.3). Liver Function Tests:  Recent Labs Lab 10/29/13 1009  AST 15  ALT 13  ALKPHOS 66  BILITOT 0.48  PROT 7.7  ALBUMIN 4.0   CBC:  Recent Labs Lab 10/29/13 1009  WBC 4.9  NEUTROABS 3.4  HGB 14.4  HCT 42.2  MCV 90.8  PLT 183   Thyroid function studies No results found for this basename: TSH, T4TOTAL, FREET3, T3FREE, THYROIDAB,  in the last 72 hours RADIOGRAPHIC STUDIES: MR Cervical Spine (12/27/2012) IMPRESSION:  Abnormal MRI cervical spine (with and without) demonstrating: 1. At C4-5, C5-6 disc bulging and uncovertebral joint hypertrophy with mild spinal stenosis and severe bilateral foraminal  stenosis 2. At C3-4 disc bulging and uncovertebral joint hypertrophy with severe bilateral foraminal stenosis 3. At C6-7 disc bulging with moderate bilateral foraminal stenosis 4. No intrinsic or enhancing spinal cord lesions  ASSESSMENT: Samuel Leon 58 y.o. male with a history of Cancer of tonsil - Plan: CBC with Differential, Comprehensive metabolic panel (Cmet) - [facility], TSH  Chemotherapy-induced neuropathy   PLAN:  1. Cancer of tonsil. --Mr. Portell continues to do well, now out almost 5years from the time of diagnosis in May 2010. He remains disease-free and doing well without any apparent complications related to his treatment.   2. Chemotherapy-induced neuropathy. --Not sure it is entirely related to his chemotherapy given his recent MRI of the neck demonstrated multiple structural abnormalities including at C4-5, C5-6 disc bulging and uncovertebral joint hypertrophy with mild spinal stenosis and severe bilateral foraminal stenosis, and at C3-4 disc bulging and uncovertebral joint hypertrophy with severe bilateral foraminal stenosis, and at C6-7 disc bulging with moderate bilateral foraminal stenosis. He is following with neurology.  Continue Neurotin and neuropathy cream.   3. Hypothyroidism.  --TSH, WNL. Continue current synthroid dose.    4. Follow-up. --I have asked the patient to return in 6 months, at which time we will check CBC, chemistries and TSH   All questions were answered. The patient knows to call the clinic with any problems, questions or concerns. We can certainly see the patient much sooner if necessary.  I spent 15 minutes counseling the patient face to face. The total time spent in the appointment was 25 minutes.    Concha Norway, MD 10/29/2013 11:01 AM

## 2014-02-18 ENCOUNTER — Ambulatory Visit: Payer: BC Managed Care – PPO | Admitting: Diagnostic Neuroimaging

## 2014-03-30 ENCOUNTER — Encounter: Payer: Self-pay | Admitting: *Deleted

## 2014-04-18 ENCOUNTER — Other Ambulatory Visit: Payer: Self-pay

## 2014-04-29 ENCOUNTER — Other Ambulatory Visit: Payer: Self-pay | Admitting: *Deleted

## 2014-04-29 DIAGNOSIS — C099 Malignant neoplasm of tonsil, unspecified: Secondary | ICD-10-CM

## 2014-04-30 ENCOUNTER — Ambulatory Visit (HOSPITAL_BASED_OUTPATIENT_CLINIC_OR_DEPARTMENT_OTHER): Payer: Medicare Other | Admitting: Hematology

## 2014-04-30 ENCOUNTER — Telehealth: Payer: Self-pay | Admitting: Hematology

## 2014-04-30 ENCOUNTER — Other Ambulatory Visit (HOSPITAL_BASED_OUTPATIENT_CLINIC_OR_DEPARTMENT_OTHER): Payer: Medicare Other

## 2014-04-30 VITALS — BP 150/88 | HR 98 | Temp 98.2°F | Resp 18 | Ht 70.0 in | Wt 195.8 lb

## 2014-04-30 DIAGNOSIS — C760 Malignant neoplasm of head, face and neck: Secondary | ICD-10-CM

## 2014-04-30 DIAGNOSIS — E039 Hypothyroidism, unspecified: Secondary | ICD-10-CM | POA: Diagnosis not present

## 2014-04-30 DIAGNOSIS — C099 Malignant neoplasm of tonsil, unspecified: Secondary | ICD-10-CM | POA: Diagnosis not present

## 2014-04-30 DIAGNOSIS — Z23 Encounter for immunization: Secondary | ICD-10-CM | POA: Diagnosis not present

## 2014-04-30 DIAGNOSIS — G620 Drug-induced polyneuropathy: Secondary | ICD-10-CM

## 2014-04-30 LAB — COMPREHENSIVE METABOLIC PANEL (CC13)
ALK PHOS: 68 U/L (ref 40–150)
ALT: 23 U/L (ref 0–55)
AST: 17 U/L (ref 5–34)
Albumin: 4 g/dL (ref 3.5–5.0)
Anion Gap: 8 mEq/L (ref 3–11)
BILIRUBIN TOTAL: 0.52 mg/dL (ref 0.20–1.20)
BUN: 12.5 mg/dL (ref 7.0–26.0)
CO2: 25 mEq/L (ref 22–29)
CREATININE: 1.3 mg/dL (ref 0.7–1.3)
Calcium: 9.5 mg/dL (ref 8.4–10.4)
Chloride: 104 mEq/L (ref 98–109)
GLUCOSE: 110 mg/dL (ref 70–140)
Potassium: 4.6 mEq/L (ref 3.5–5.1)
Sodium: 136 mEq/L (ref 136–145)
Total Protein: 7.3 g/dL (ref 6.4–8.3)

## 2014-04-30 LAB — CBC WITH DIFFERENTIAL/PLATELET
BASO%: 1.6 % (ref 0.0–2.0)
BASOS ABS: 0.1 10*3/uL (ref 0.0–0.1)
EOS%: 2.2 % (ref 0.0–7.0)
Eosinophils Absolute: 0.1 10*3/uL (ref 0.0–0.5)
HCT: 42.6 % (ref 38.4–49.9)
HEMOGLOBIN: 14.2 g/dL (ref 13.0–17.1)
LYMPH%: 23.5 % (ref 14.0–49.0)
MCH: 30.9 pg (ref 27.2–33.4)
MCHC: 33.4 g/dL (ref 32.0–36.0)
MCV: 92.5 fL (ref 79.3–98.0)
MONO#: 0.3 10*3/uL (ref 0.1–0.9)
MONO%: 4.8 % (ref 0.0–14.0)
NEUT%: 67.9 % (ref 39.0–75.0)
NEUTROS ABS: 3.8 10*3/uL (ref 1.5–6.5)
PLATELETS: 231 10*3/uL (ref 140–400)
RBC: 4.6 10*6/uL (ref 4.20–5.82)
RDW: 12.7 % (ref 11.0–14.6)
WBC: 5.5 10*3/uL (ref 4.0–10.3)
lymph#: 1.3 10*3/uL (ref 0.9–3.3)

## 2014-04-30 MED ORDER — PNEUMOCOCCAL VAC POLYVALENT 25 MCG/0.5ML IJ INJ
0.5000 mL | INJECTION | Freq: Once | INTRAMUSCULAR | Status: AC
  Administered 2014-04-30: 0.5 mL via INTRAMUSCULAR
  Filled 2014-04-30: qty 0.5

## 2014-04-30 MED ORDER — INFLUENZA VAC SPLIT QUAD 0.5 ML IM SUSY
0.5000 mL | PREFILLED_SYRINGE | Freq: Once | INTRAMUSCULAR | Status: AC
  Administered 2014-04-30: 0.5 mL via INTRAMUSCULAR
  Filled 2014-04-30: qty 0.5

## 2014-04-30 NOTE — Telephone Encounter (Signed)
s.w. pt wife and advised on OCT 2016 appt....ok and aware

## 2014-05-01 ENCOUNTER — Encounter: Payer: Self-pay | Admitting: Hematology

## 2014-05-01 NOTE — Progress Notes (Signed)
[facility] PROGRESS NOTE DATE OF VISIT: 05/01/2014  Samuel Shelter, PA-C [address] [place] [address]  DIAGNOSIS: Head and neck cancer - Plan: Influenza vac split quadrivalent PF (FLUARIX) injection 0.5 mL, pneumococcal 23 valent vaccine (PNU-IMMUNE) injection 0.5 mL, CBC with Differential, Comprehensive metabolic panel (Cmet) - [facility], DG Chest 2 View  Chief Complaint  Patient presents with  . Follow-up    CURRENT THERAPY:  Observation.    INTERVAL HISTORY:  Samuel Leon 58 y.o. 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  He was HPV positive.  He received 2 doses of cisplatin 100 mg/m2 on 01/12/2009 and 02/02/2009 while at the same time receiving radiation IMRT 70 Gy in 35 fractions from 01/12/2009 through 03/03/2009.   He has been disease free since that time.  He was last seen by Dr Juliann Mule on 10/29/2013.    In general, he is doing very well, fully active, without any symptoms to suggest recurrent head and neck cancer. He complains of intermittent neuropathy of both hands tingling at times becoming painful so that he loses his grip.  This is the same.  Every now then, he has tingling in his feet but this is intermittent.  He was evaluated by neurology (Dr. Leta Baptist) who recommended a trial of gabapentin which he completed without relief. He is on 600 mg bid.  He was also given a pain cream which is very effective and comes from a compound pharmacy. It has Meloxicam, doxepin, amantadine, dxm and lidocaine in it. He does not take it consistently as prescribed. He has some balance problems and memory changes and I told him to take OTC B12 vitamin for those symptoms which can also help with neuropathy. He did get a flu and pneumonia shot in office today. I examined him and reviewed his labs. He is going to schedule an ENT follow up for annual appointment with his  history of tonsillar cancer.   MEDICAL HISTORY: Past Medical History  Diagnosis Date  . Cancer     tonsillar ca  . Hypothyroid   . Weight loss   . HTN (hypertension)   . CAD (coronary artery disease)     INTERIM HISTORY: has Cancer of tonsil; Unspecified hereditary and idiopathic peripheral neuropathy; Chemotherapy-induced neuropathy; and Low back pain on his problem list.    ALLERGIES:  is allergic to aspirin.  MEDICATIONS: has a current medication list which includes the following prescription(s): alprazolam, amlodipine-benazepril, cyclobenzaprine, levothyroxine sodium, multivitamin, NONFORMULARY OR COMPOUNDED ITEM, and temazepam.  SURGICAL HISTORY: History reviewed. No pertinent past surgical history.  PROBLEM LIST:  1. Squamous cell carcinoma of the right tonsil, T1 N2b, IVA diagnosed by biopsy on 11/19/2008. 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 The patient received treatments under the direction of  Dr. Sherryl Manges and Dr. Thea Silversmith. He received 2 doses of cisplatin 100 mg/m2 (equal to 210 mg) on 01/12/2009 and 02/02/2009 while at the same time receiving radiation IMRT 70 Gy in 35  fractions from 01/12/2009 through 03/03/2009. The patient has remained disease-free since that time.  2. Hypertension.  3. Hypothyroidism.  4. Tinnitus.  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  6. Coronary artery disease by CT scan. The patient underwent an exercise stress test in the spring of 2013, which showed no evidence for ischemia, with adequate exercise tolerance.  REVIEW OF SYSTEMS:   Constitutional: Denies fevers, chills  or abnormal weight loss Eyes: Denies blurriness of vision Ears, nose, mouth, throat, and face: Denies mucositis or sore throat Respiratory: Denies cough, dyspnea or wheezes Cardiovascular: Denies palpitation, chest discomfort or lower extremity swelling Gastrointestinal:  Denies nausea, heartburn or change in bowel habits Skin: Denies abnormal  skin rashes Lymphatics: Denies new lymphadenopathy or easy bruising Neurological:Denies numbness, tingling or new weaknesses Behavioral/Psych: Mood is stable, no new changes  All other systems were reviewed with the patient and are negative.  PHYSICAL EXAMINATION: ECOG PERFORMANCE STATUS: 0  Blood pressure 150/88, pulse 98, temperature 98.2 F (36.8 C), temperature source Oral, resp. rate 18, height 5\' 10"  (1.778 m), weight 195 lb 12.8 oz (88.814 kg), SpO2 99.00%.  GENERAL:alert, no distress and comfortable; well developed and well nourished.  SKIN: skin color, texture, turgor are normal, no rashes or significant lesions EYES: normal, Conjunctiva are pink and non-injected, sclera clear OROPHARYNX:no exudate, no erythema and lips, buccal mucosa, and tongue normal  NECK: supple, thyroid normal size, non-tender, without nodularity LYMPH:  no palpable lymphadenopathy in the cervical, axillary or supraclavicular LUNGS: clear to auscultation and percussion with normal breathing effort HEART: regular rate & rhythm and no murmurs and no lower extremity edema ABDOMEN:abdomen soft, non-tender and normal bowel sounds Musculoskeletal:no cyanosis of digits and no clubbing  NEURO: alert & oriented x 3 with fluent speech, no focal motor/sensory deficits   LABORATORY DATA: Results for orders placed in visit on 04/30/14 (from the past 48 hour(s))  CBC WITH DIFFERENTIAL     Status: None   Collection Time    04/30/14  9:58 AM      Result Value Ref Range   WBC 5.5  4.0 - 10.3 10e3/uL   NEUT# 3.8  1.5 - 6.5 10e3/uL   HGB 14.2  13.0 - 17.1 g/dL   HCT 42.6  38.4 - 49.9 %   Platelets 231  140 - 400 10e3/uL   MCV 92.5  79.3 - 98.0 fL   MCH 30.9  27.2 - 33.4 pg   MCHC 33.4  32.0 - 36.0 g/dL   RBC 4.60  4.20 - 5.82 10e6/uL   RDW 12.7  11.0 - 14.6 %   lymph# 1.3  0.9 - 3.3 10e3/uL   MONO# 0.3  0.1 - 0.9 10e3/uL   Eosinophils Absolute 0.1  0.0 - 0.5 10e3/uL   Basophils Absolute 0.1  0.0 - 0.1 10e3/uL    NEUT% 67.9  39.0 - 75.0 %   LYMPH% 23.5  14.0 - 49.0 %   MONO% 4.8  0.0 - 14.0 %   EOS% 2.2  0.0 - 7.0 %   BASO% 1.6  0.0 - 2.0 %  COMPREHENSIVE METABOLIC PANEL (AJ68)     Status: None   Collection Time    04/30/14  9:58 AM      Result Value Ref Range   Sodium 136  136 - 145 mEq/L   Potassium 4.6  3.5 - 5.1 mEq/L   Chloride 104  98 - 109 mEq/L   CO2 25  22 - 29 mEq/L   Glucose 110  70 - 140 mg/dl   BUN 12.5  7.0 - 26.0 mg/dL   Creatinine 1.3  0.7 - 1.3 mg/dL   Total Bilirubin 0.52  0.20 - 1.20 mg/dL   Alkaline Phosphatase 68  40 - 150 U/L   AST 17  5 - 34 U/L   ALT 23  0 - 55 U/L   Total Protein 7.3  6.4 - 8.3 g/dL  Albumin 4.0  3.5 - 5.0 g/dL   Calcium 9.5  8.4 - 10.4 mg/dL   Anion Gap 8  3 - 11 mEq/L    Labs:  Lab Results  Component Value Date   WBC 5.5 04/30/2014   HGB 14.2 04/30/2014   HCT 42.6 04/30/2014   MCV 92.5 04/30/2014   PLT 231 04/30/2014   NEUTROABS 3.8 04/30/2014      Chemistry      Component Value Date/Time   NA 136 04/30/2014 0958   NA 133* 09/06/2011 1514   K 4.6 04/30/2014 0958   K 3.7 09/06/2011 1514   CL 96* 10/30/2012 0837   CL 96 09/06/2011 1514   CO2 25 04/30/2014 0958   CO2 27 09/06/2011 1514   BUN 12.5 04/30/2014 0958   BUN 16 09/06/2011 1514   CREATININE 1.3 04/30/2014 0958   CREATININE 1.29 09/06/2011 1514      Component Value Date/Time   CALCIUM 9.5 04/30/2014 0958   CALCIUM 9.7 09/06/2011 1514   ALKPHOS 68 04/30/2014 0958   ALKPHOS 53 09/06/2011 1514   AST 17 04/30/2014 0958   AST 17 09/06/2011 1514   ALT 23 04/30/2014 0958   ALT 12 09/06/2011 1514   BILITOT 0.52 04/30/2014 0958   BILITOT 0.4 09/06/2011 1514     Basic Metabolic Panel:  Recent Labs Lab 04/30/14 0958  NA 136  K 4.6  CO2 25  GLUCOSE 110  BUN 12.5  CREATININE 1.3  CALCIUM 9.5   GFR Estimated Creatinine Clearance: 70.3 ml/min (by C-G formula based on Cr of 1.3). Liver Function Tests:  Recent Labs Lab 04/30/14 0958  AST 17  ALT 23  ALKPHOS 68  BILITOT 0.52   PROT 7.3  ALBUMIN 4.0   CBC:  Recent Labs Lab 04/30/14 0958  WBC 5.5  NEUTROABS 3.8  HGB 14.2  HCT 42.6  MCV 92.5  PLT 231   Thyroid function studies No results found for this basename: TSH, T4TOTAL, FREET3, T3FREE, THYROIDAB,  in the last 72 hours RADIOGRAPHIC STUDIES: MR Cervical Spine (12/27/2012) IMPRESSION:  Abnormal MRI cervical spine (with and without) demonstrating: 1. At C4-5, C5-6 disc bulging and uncovertebral joint hypertrophy with mild spinal stenosis and severe bilateral foraminal stenosis 2. At C3-4 disc bulging and uncovertebral joint hypertrophy with severe bilateral foraminal stenosis 3. At C6-7 disc bulging with moderate bilateral foraminal stenosis 4. No intrinsic or enhancing spinal cord lesions  ASSESSMENT: Brookes Craine Lobello 58 y.o. male with a history of Head and neck cancer - Plan: Influenza vac split quadrivalent PF (FLUARIX) injection 0.5 mL, pneumococcal 23 valent vaccine (PNU-IMMUNE) injection 0.5 mL, CBC with Differential, Comprehensive metabolic panel (Cmet) - [facility], DG Chest 2 View   PLAN:  1. Cancer of tonsil. --Mr. Blick continues to do well, now out almost 5years from the time of diagnosis in May 2010. He remains disease-free and doing well without any apparent complications related to his treatment.   2. Chemotherapy-induced neuropathy. --Not sure it is entirely related to his chemotherapy given his recent MRI of the neck demonstrated multiple structural abnormalities including at C4-5, C5-6 disc bulging and uncovertebral joint hypertrophy with mild spinal stenosis and severe bilateral foraminal stenosis, and at C3-4 disc bulging and uncovertebral joint hypertrophy with severe bilateral foraminal stenosis, and at C6-7 disc bulging with moderate bilateral foraminal stenosis. He is following with neurology.  Continue Neurotin and neuropathy cream.   3. Hypothyroidism.  --TSH, WNL. Continue current synthroid dose.    4. Follow-up. --I have  asked the patient to return in 1year, at which time we will check CBC, CMP and a chest xray. He did get the flu shot and pneumonia shot in office today.   All questions were answered. The patient knows to call the clinic with any problems, questions or concerns. We can certainly see the patient much sooner if necessary.  I spent 15 minutes counseling the patient face to face. The total time spent in the appointment was 25 minutes.    Bernadene Bell, MD Medical Hematologist/Oncologist [facility] Pager: [phone] Office No: [phone]

## 2015-03-05 ENCOUNTER — Telehealth: Payer: Self-pay | Admitting: Hematology

## 2015-03-05 NOTE — Telephone Encounter (Signed)
returned call and s.w. pt wife and confirm OCT appt....ok and aware

## 2015-03-17 ENCOUNTER — Telehealth: Payer: Self-pay | Admitting: Hematology and Oncology

## 2015-03-17 NOTE — Telephone Encounter (Signed)
Former CP1 patient was moved to [facility] after wife called inquiring about appointment. Due to dx patient moved to [facility]. Spoke with wife re lab/[facility] 10/24 @ 11:30 am.

## 2015-04-27 ENCOUNTER — Encounter: Payer: Self-pay | Admitting: Hematology and Oncology

## 2015-04-27 ENCOUNTER — Other Ambulatory Visit: Payer: Self-pay | Admitting: *Deleted

## 2015-04-27 ENCOUNTER — Ambulatory Visit: Payer: Medicare Other

## 2015-04-27 ENCOUNTER — Telehealth: Payer: Self-pay | Admitting: Hematology and Oncology

## 2015-04-27 ENCOUNTER — Ambulatory Visit (HOSPITAL_BASED_OUTPATIENT_CLINIC_OR_DEPARTMENT_OTHER): Payer: BC Managed Care – PPO | Admitting: Hematology and Oncology

## 2015-04-27 ENCOUNTER — Other Ambulatory Visit (HOSPITAL_BASED_OUTPATIENT_CLINIC_OR_DEPARTMENT_OTHER): Payer: Medicare Other

## 2015-04-27 VITALS — BP 155/97 | HR 83 | Temp 98.5°F | Resp 16 | Ht 70.0 in | Wt 190.1 lb

## 2015-04-27 DIAGNOSIS — G620 Drug-induced polyneuropathy: Secondary | ICD-10-CM

## 2015-04-27 DIAGNOSIS — G252 Other specified forms of tremor: Secondary | ICD-10-CM

## 2015-04-27 DIAGNOSIS — H9193 Unspecified hearing loss, bilateral: Secondary | ICD-10-CM

## 2015-04-27 DIAGNOSIS — C099 Malignant neoplasm of tonsil, unspecified: Secondary | ICD-10-CM

## 2015-04-27 DIAGNOSIS — R258 Other abnormal involuntary movements: Secondary | ICD-10-CM

## 2015-04-27 DIAGNOSIS — Z85818 Personal history of malignant neoplasm of other sites of lip, oral cavity, and pharynx: Secondary | ICD-10-CM

## 2015-04-27 DIAGNOSIS — T451X5A Adverse effect of antineoplastic and immunosuppressive drugs, initial encounter: Secondary | ICD-10-CM

## 2015-04-27 DIAGNOSIS — R5383 Other fatigue: Secondary | ICD-10-CM | POA: Diagnosis not present

## 2015-04-27 DIAGNOSIS — R682 Dry mouth, unspecified: Secondary | ICD-10-CM

## 2015-04-27 DIAGNOSIS — I10 Essential (primary) hypertension: Secondary | ICD-10-CM

## 2015-04-27 DIAGNOSIS — Z85819 Personal history of malignant neoplasm of unspecified site of lip, oral cavity, and pharynx: Secondary | ICD-10-CM

## 2015-04-27 DIAGNOSIS — E039 Hypothyroidism, unspecified: Secondary | ICD-10-CM

## 2015-04-27 DIAGNOSIS — R259 Unspecified abnormal involuntary movements: Secondary | ICD-10-CM

## 2015-04-27 HISTORY — DX: Personal history of malignant neoplasm of other sites of lip, oral cavity, and pharynx: Z85.818

## 2015-04-27 HISTORY — DX: Other fatigue: R53.83

## 2015-04-27 LAB — COMPREHENSIVE METABOLIC PANEL (CC13)
ALBUMIN: 4.2 g/dL (ref 3.5–5.0)
ALK PHOS: 59 U/L (ref 40–150)
ALT: 39 U/L (ref 0–55)
AST: 35 U/L — ABNORMAL HIGH (ref 5–34)
Anion Gap: 10 mEq/L (ref 3–11)
BUN: 14.1 mg/dL (ref 7.0–26.0)
CALCIUM: 9.7 mg/dL (ref 8.4–10.4)
CO2: 23 mEq/L (ref 22–29)
Chloride: 106 mEq/L (ref 98–109)
Creatinine: 1.2 mg/dL (ref 0.7–1.3)
EGFR: 70 mL/min/{1.73_m2} — AB (ref >90)
Glucose: 99 mg/dl (ref 70–140)
POTASSIUM: 4.1 meq/L (ref 3.5–5.1)
SODIUM: 139 meq/L (ref 136–145)
Total Bilirubin: 0.67 mg/dL (ref 0.20–1.20)
Total Protein: 7.5 g/dL (ref 6.4–8.3)

## 2015-04-27 LAB — CBC WITH DIFFERENTIAL/PLATELET
BASO%: 1.1 % (ref 0.0–2.0)
BASOS ABS: 0.1 10*3/uL (ref 0.0–0.1)
EOS ABS: 0.1 10*3/uL (ref 0.0–0.5)
EOS%: 1.2 % (ref 0.0–7.0)
HEMATOCRIT: 41.8 % (ref 38.4–49.9)
HGB: 14.5 g/dL (ref 13.0–17.1)
LYMPH#: 1.2 10*3/uL (ref 0.9–3.3)
LYMPH%: 18.5 % (ref 14.0–49.0)
MCH: 33 pg (ref 27.2–33.4)
MCHC: 34.6 g/dL (ref 32.0–36.0)
MCV: 95.3 fL (ref 79.3–98.0)
MONO#: 0.3 10*3/uL (ref 0.1–0.9)
MONO%: 5.3 % (ref 0.0–14.0)
NEUT#: 4.7 10*3/uL (ref 1.5–6.5)
NEUT%: 73.9 % (ref 39.0–75.0)
PLATELETS: 255 10*3/uL (ref 140–400)
RBC: 4.39 10*6/uL (ref 4.20–5.82)
RDW: 13 % (ref 11.0–14.6)
WBC: 6.4 10*3/uL (ref 4.0–10.3)

## 2015-04-27 NOTE — Telephone Encounter (Signed)
per pof to sch pt appt-gave pt copy of avs-sent email to gretchen to sch pt for Survivorship [facility]-pt understood

## 2015-04-28 DIAGNOSIS — I10 Essential (primary) hypertension: Secondary | ICD-10-CM | POA: Insufficient documentation

## 2015-04-28 DIAGNOSIS — R682 Dry mouth, unspecified: Secondary | ICD-10-CM | POA: Insufficient documentation

## 2015-04-28 DIAGNOSIS — H9190 Unspecified hearing loss, unspecified ear: Secondary | ICD-10-CM | POA: Insufficient documentation

## 2015-04-28 DIAGNOSIS — E039 Hypothyroidism, unspecified: Secondary | ICD-10-CM | POA: Insufficient documentation

## 2015-04-28 DIAGNOSIS — G252 Other specified forms of tremor: Secondary | ICD-10-CM | POA: Insufficient documentation

## 2015-04-28 DIAGNOSIS — R259 Unspecified abnormal involuntary movements: Secondary | ICD-10-CM

## 2015-04-28 NOTE — Assessment & Plan Note (Signed)
He has some residual neuropathy from prior chemotherapy. The patient has significant peripheral neuropathy in his hands and some gait imbalances. He has been placed on a few different medications in the past without success. It is not severe right now. I recommend observation only.

## 2015-04-28 NOTE — Assessment & Plan Note (Signed)
His blood pressure is a little high which is suspected be related to anxiety/white coat hypertension. he will continue current medical management. I recommend close follow-up with primary care doctor for medication adjustment.

## 2015-04-28 NOTE — Assessment & Plan Note (Signed)
The cause is unknown but he attributed that to side effects of treatment.  I recommend consideration to add propranolol in the future if his blood pressure remained high as it might treat both hypertension and tremors.

## 2015-04-28 NOTE — Assessment & Plan Note (Signed)
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 I recommend ENT follow-up to address this.

## 2015-04-28 NOTE — Assessment & Plan Note (Addendum)
Clinically, he has no signs of disease. I told him there is no substitute for ENT follow-up. He has not seen Dr. Redmond Baseman for over 3-1/2 years. I rcommend he makes a follow-up appointment. The patient is considered a long-term cancer survivor. I recommend transitioning his care to cancer survivorship clinic in the next visit.

## 2015-04-28 NOTE — Progress Notes (Signed)
[facility] progress notes  Patient Care Team: [name], PA-C as PCP - General (Physician Assistant) Heath Lark, MD as Consulting Physician (Hematology and Oncology) Melida Quitter, MD as Consulting Physician (Otolaryngology)  CHIEF COMPLAINTS/PURPOSE OF VISIT:   history of tonsil cancer, for further follow-up  HISTORY OF PRESENTING ILLNESS:  Samuel Leon 59 y.o. male was transferred to my care after his prior physician has left.  I reviewed the patient's records extensive and collaborated the history with the patient. Summary of his history is as follows: The patient was originally diagnosed when he self palpated a lump in the neck. 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 He only received 2 doses of high-dose cisplatin, complicated by various side effects causing him chronic neuropathy, tremors, profound nausea and vomiting. His chemotherapy has to be terminated early because of side effects. He received 2 doses of cisplatin 100 mg/m2 on 01/12/2009 and 02/02/2009 while at the same time receiving radiation IMRT 70 Gy in 35 fractions from 01/12/2009 through 03/03/2009. He has been disease free since that time The patient had varus side effects including chronic tinnitus, bilateral hearing loss, peripheral neuropathy, tremors, gait imbalances, chronic dry mouth and chronic fatigue since treatment.  He denies any swallowing difficulties. No new dental issues. He has not seen Dr. Redmond Baseman for a long time. He has not had screening colonoscopy done. MEDICAL HISTORY:  Past Medical History  Diagnosis Date  . Cancer ([place])     tonsillar ca  . Hypothyroid   . Weight loss   . HTN (hypertension)   . CAD (coronary artery disease)   . Other fatigue 04/27/2015  . History of cancer tonsil 04/27/2015    SURGICAL HISTORY: History reviewed. No pertinent past surgical history.  SOCIAL HISTORY: Social History    Social History  . Marital Status: Married    Spouse Name: Jerrye Beavers  . Number of Children: 1  . Years of Education: HS   Occupational History  .      N/a   Social History Main Topics  . Smoking status: Former Smoker -- 0.30 packs/day for 1 years    Types: Cigarettes    Quit date: 07/04/1968  . Smokeless tobacco: Never Used  . Alcohol Use: Yes  . Drug Use: No  . Sexual Activity: Not on file   Other Topics Concern  . Not on file   Social History Narrative   Patient lives at home with family.   Caffeine Use: 1-2 cups daily    FAMILY HISTORY: Family History  Problem Relation Age of Onset  . Heart attack Mother   . Cancer Paternal Aunt     breast ca    ALLERGIES:  is allergic to aspirin.  MEDICATIONS:  Current Outpatient Prescriptions  Medication Sig Dispense Refill  . ALPRAZolam (XANAX) 0.5 MG tablet Take 0.25 mg by mouth 4 (four) times daily as needed.     Marland Kitchen amLODipine-benazepril (LOTREL) 5-10 MG per capsule Take 1 capsule by mouth daily.    . cyclobenzaprine (FLEXERIL) 10 MG tablet Take 10 mg by mouth 3 (three) times daily as needed for muscle spasms.    . Levothyroxine Sodium (TIROSINT) 88 MCG CAPS Take 88 mcg by mouth daily before breakfast.    . Multiple Vitamin (MULTIVITAMIN) tablet Take 1 tablet by mouth daily.    . NONFORMULARY OR COMPOUNDED ITEM [employer] - meloxi/doxep/amanta/dxm/lidocaine (25) 1-2 pumps 3-4 times daily  on hands for neuropathy    . temazepam (RESTORIL) 15 MG capsule Take 15 mg by mouth at bedtime as needed.     No current facility-administered medications for this visit.    REVIEW OF SYSTEMS:   Constitutional: Denies fevers, chills or abnormal night sweats Eyes: Denies blurriness of vision, double vision or watery eyes Ears, nose, mouth, throat, and face: Denies mucositis or sore throat Respiratory: Denies cough, dyspnea or wheezes Cardiovascular: Denies palpitation, chest discomfort or lower extremity  swelling Gastrointestinal:  Denies nausea, heartburn or change in bowel habits Skin: Denies abnormal skin rashes Lymphatics: Denies new lymphadenopathy or easy bruising Neurological:Denies numbness, tingling or new weaknesses Behavioral/Psych: Mood is stable, no new changes  All other systems were reviewed with the patient and are negative.  PHYSICAL EXAMINATION: ECOG PERFORMANCE STATUS: 1 - Symptomatic but completely ambulatory  Filed Vitals:   04/27/15 1138  BP: 155/97  Pulse: 83  Temp: 98.5 F (36.9 C)  Resp: 16   Filed Weights   04/27/15 1138  Weight: 190 lb 1.6 oz (86.229 kg)    GENERAL:alert, no distress and comfortable SKIN: skin color, texture, turgor are normal, no rashes or significant lesions EYES: normal, conjunctiva are pink and non-injected, sclera clear OROPHARYNX:no exudate, normal lips, buccal mucosa, and tongue  NECK:  Very subtle lymphedema from prior treatment. LYMPH:  no palpable lymphadenopathy in the cervical, axillary or inguinal LUNGS: clear to auscultation and percussion with normal breathing effort HEART: regular rate & rhythm and no murmurs without lower extremity edema ABDOMEN:abdomen soft, non-tender and normal bowel sounds Musculoskeletal:no cyanosis of digits and no clubbing  PSYCH: alert & oriented x 3 with fluent speech NEURO: no focal motor/sensory deficits. Noted resting tremors  LABORATORY DATA:  I have reviewed the data as listed Lab Results  Component Value Date   WBC 6.4 04/27/2015   HGB 14.5 04/27/2015   HCT 41.8 04/27/2015   MCV 95.3 04/27/2015   PLT 255 04/27/2015    Recent Labs  04/30/14 0958 04/27/15 1147  NA 136 139  K 4.6 4.1  CO2 25 23  GLUCOSE 110 99  BUN 12.5 14.1  CREATININE 1.3 1.2  CALCIUM 9.5 9.7  PROT 7.3 7.5  ALBUMIN 4.0 4.2  AST 17 35*  ALT 23 39  ALKPHOS 68 59  BILITOT 0.52 0.67   ASSESSMENT & PLAN:  History of cancer tonsil  Clinically, he has no signs of disease. I told him there is no  substitute for ENT follow-up. He has not seen Dr. Redmond Baseman for over 3-1/2 years. I rcommend he makes a follow-up appointment. The patient is considered a long-term cancer survivor. I recommend transitioning his care to cancer survivorship clinic in the next visit.  Other fatigue  He has chronic fatigue. I suspect it could be related to deconditioning.  I do not have his last TSH checked here. According to him, he has been getting his blood work through his primary care provider and medication refills. I will defer to his primary care doctor to monitor his TSH level  Chemotherapy-induced neuropathy ([place])  He has some residual neuropathy from prior chemotherapy. The patient has significant peripheral neuropathy in his hands and some gait imbalances. He has been placed on a few different medications in the past without success. It is not severe right now. I recommend observation only.  Essential hypertension  His blood pressure is a little high which is suspected be related to anxiety/white coat hypertension. he will continue current medical management. I recommend  close follow-up with primary care doctor for medication adjustment.   Resting tremor  The cause is unknown but he attributed that to side effects of treatment.  I recommend consideration to add propranolol in the future if his blood pressure remained high as it might treat both hypertension and tremors.  Hypothyroidism (acquired)  This is likely related to side effects of treatment.  he will get medication refills through primary care doctor.  Hearing loss  He has chronic tinnitus and hearing loss since chemotherapy. I recommend ENT follow-up to address this.  Dry mouth This is not severe, related to prior radiation treatment. He continues to take sips of water.   Orders Placed This Encounter  Procedures  . TSH    Standing Status: Future     Number of Occurrences:      Standing Expiration Date: 05/31/2016  . Amb  Referral to Survivorship Long term    Referral Priority:  Routine    Referral Type:  Consultation    Referred to Provider:  Holley Bouche, NP    Number of Visits Requested:  1    All questions were answered. The patient knows to call the clinic with any problems, questions or concerns. I spent 30 minutes counseling the patient face to face. The total time spent in the appointment was 40 minutes and more than 50% was on counseling.     [facility], [place], MD 04/28/2015 4:12 PM

## 2015-04-28 NOTE — Assessment & Plan Note (Signed)
He has chronic fatigue. I suspect it could be related to deconditioning.  I do not have his last TSH checked here. According to him, he has been getting his blood work through his primary care provider and medication refills. I will defer to his primary care doctor to monitor his TSH level

## 2015-04-28 NOTE — Assessment & Plan Note (Signed)
This is likely related to side effects of treatment.  he will get medication refills through primary care doctor.

## 2015-04-28 NOTE — Assessment & Plan Note (Signed)
This is not severe, related to prior radiation treatment. He continues to take sips of water.

## 2015-04-29 ENCOUNTER — Telehealth: Payer: Self-pay | Admitting: Adult Health

## 2015-04-29 ENCOUNTER — Other Ambulatory Visit: Payer: Self-pay | Admitting: Adult Health

## 2015-04-29 DIAGNOSIS — Z85818 Personal history of malignant neoplasm of other sites of lip, oral cavity, and pharynx: Secondary | ICD-10-CM

## 2015-04-29 DIAGNOSIS — E039 Hypothyroidism, unspecified: Secondary | ICD-10-CM

## 2015-04-29 NOTE — Telephone Encounter (Signed)
I spoke with Mr. Biss to make him aware of his survivorship appt scheduled with me in April 2017, at the request of Dr. Alvy Bimler.  I briefly explained to him the purpose of survivorship and my role in his surveillance and cancer care.  As suggested by Dr. Alvy Bimler, I will see Mr. Adriano 6 months from his last visit with her, which will be 10/2015.  I have ordered a TSH and lab visit for that day as well.  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    He expressed verbal understanding of his appointments.  I have also mailed a copy of his appt calendar, as well as a letter detailing the purpose of survivorship and my business card.  I encouraged him to call me with any questions or concerns he may have before his next visit here.  I can always see him sooner, if needed.  I look forward to participating in his care.   Mike Craze, NP [place] [phone]

## 2015-10-26 ENCOUNTER — Telehealth: Payer: Self-pay | Admitting: Adult Health

## 2015-10-26 ENCOUNTER — Encounter: Payer: Self-pay | Admitting: Adult Health

## 2015-10-26 DIAGNOSIS — C099 Malignant neoplasm of tonsil, unspecified: Secondary | ICD-10-CM | POA: Insufficient documentation

## 2015-10-26 NOTE — Telephone Encounter (Signed)
I received a call from Samuel Leon stating that he recently had labs drawn at his PCP's office about 2 months ago and wold like to see if we could get the records from his PCP in lieu of him getting blood drawn again this week.    I let him know that this was completely reasonable.  I let him know that the only lab we like to check for our H&N survivors is a TSH, which it looks like his PCP has been managing his hypothyroidism anyway.  I have cancelled his lab appt before his visit with me.  I have also made a call to the patient's PCP office [facility]) and requested they fax the most recent labs for me to review.  I let Samuel Leon know that I would call him if I had any concerns about his labs and needed to add more.  He voiced appreciation and understanding.  I look forward to seeing him on Wednesday!  Mike Craze, NP [place] [phone]

## 2015-10-28 ENCOUNTER — Encounter: Payer: Self-pay | Admitting: Adult Health

## 2015-10-28 ENCOUNTER — Ambulatory Visit (HOSPITAL_BASED_OUTPATIENT_CLINIC_OR_DEPARTMENT_OTHER): Payer: BC Managed Care – PPO | Admitting: Adult Health

## 2015-10-28 ENCOUNTER — Other Ambulatory Visit: Payer: Medicare Other

## 2015-10-28 VITALS — BP 144/93 | HR 77 | Temp 98.0°F | Resp 14 | Wt 191.8 lb

## 2015-10-28 DIAGNOSIS — C099 Malignant neoplasm of tonsil, unspecified: Secondary | ICD-10-CM

## 2015-10-28 DIAGNOSIS — Z85819 Personal history of malignant neoplasm of unspecified site of lip, oral cavity, and pharynx: Secondary | ICD-10-CM | POA: Diagnosis not present
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
## 2015-10-29 ENCOUNTER — Telehealth: Payer: Self-pay | Admitting: *Deleted

## 2015-10-29 ENCOUNTER — Telehealth: Payer: Self-pay | Admitting: Adult Health

## 2015-10-29 NOTE — Telephone Encounter (Signed)
  Oncology Nurse Navigator Documentation  Navigator Location: [facility]-Med Onc (10/29/15 1030) Navigator Encounter Type: Telephone (10/29/15 1030) Telephone: [place] Call (10/29/15 1030)                 Interventions: Coordination of Care (10/29/15 1030)       In follow-up to Mr. Ruane 10/28/2015 Survivorship appt with Mike Craze, NP, and per her guidance, I called [facility] to arrange STAT follow-up with Dr. Redmond Baseman for potential XRT-related vestibular dysfunction.  Because he has not seen Dr. Redmond Baseman for an extensive period of time, I was directed to have him follow-up with his PCP who could then refer him back to Dr. Redmond Baseman.   I spoke with Caryl Pina at [place], shared Gretchen's assessment concerns.  She indicated there is appointment availability this afternoon, that she will call him.  I provided Elzie Rings this additional update.  [name], RN, BSN, [place] at [place] [phone]                   Time Spent with Patient: 30 (10/29/15 1030)

## 2015-10-29 NOTE — Telephone Encounter (Signed)
Received a follow-up call from Mr. Samuel Leon stating that Dr. Redmond Baseman' office called and could see him today, however the patient is unable to make that appt because he is near [place] and wouldn't be able to make the appt in time.   He stated that he talked with someone in scheduling at [facility] and let them know his availability. He called me to let me know they had called to try to get him in quickly.  He also wanted to verify that this wasn't anything life threatening that he needed to rush back into town for today.  I reiterated that as we discussed yesterday during our visit, that I would like him to see Dr. Redmond Baseman to help [place] better understand if his falling/dizziness & N&V were related to his inner ear and potentially as a result of his h/o radiation therapy to the right neck.  If we are not able to say that the ear is causing these issues, then I will want to get imaging of his brain.  I told him that it was not an emergency to be seen today, but I would like him to be seen in the next 2 weeks, if they are able to get him in.  He voiced understanding for the rationale.  I encouraged him to call me back if he didn't receive a return call in the coming days with an appt to see Dr. Redmond Baseman. He agreed.   Mike Craze, NP [place] [phone]

## 2015-10-29 NOTE — Telephone Encounter (Signed)
I received a call from the patient re: some confusion around him seeing ENT vs. PCP for his right ear pain, dizziness, falling, and N&V.  The patient stated that he spoke to someone at ENT that stated that he would not need a referral from his PCP because of his insurance.  This directly contradicts what was told to our staff here at the cancer center in order for the patient to be seen by Dr. Redmond Baseman as a "new patient" since he has not been seen there in several years.    I let the patient know that I would call [facility] myself and see if we could get him an appt as soon as possible.  I will call him back with what I am able to find out.    I was able to speak to [place] at [facility] [phone], [phone]).  She tells me that Dr. Redmond Baseman does not have any new pt appts until late May.  I explained the situation to her and asked that she speak with Dr. Redmond Baseman and/or his staff to see if there was any availability to see this patient sooner.  I explained that if we cannot rule out that his dizziness/balance issues are an inner ear disorder, then I will need to get brain imaging as soon as possible.  [place] voiced understanding and stated she would speak with Dr. Redmond Baseman and his medical assistant and someone would call me back.  I gave my direct office number for them to return my call.  Awaiting return call from [facility].   Mike Craze, NP [place] [phone]

## 2015-10-29 NOTE — Telephone Encounter (Signed)
  Oncology Nurse Navigator Documentation  Navigator Location: [facility]-Med Onc (10/29/15 1135) Navigator Encounter Type: Telephone (10/29/15 1135) Telephone: Incoming Call;Appt Confirmation/Clarification (10/29/15 1135)      Received call back from [place] at [facility] indicating Mr. Blodgett is out-of-town today but has an appt scheduled for tomorrow 4/28 1400.  I provided her Gretchen's phone # in the event his PCP has questions prior to this appt.  I provided Elzie Rings this update.  [name], RN, BSN, [place] at [place] [phone]                                     Time Spent with Patient: 15 (10/29/15 1135)

## 2015-10-29 NOTE — Telephone Encounter (Signed)
I received a call from [place] at [facility] with an update to let me know that the patient will be seen on 11/02/15 at 9 am at their office.  I thanked her for calling me and letting me know and appreciate her help in getting the patient in to be seen.   Mike Craze, NP [place] [phone]

## 2015-11-07 ENCOUNTER — Emergency Department (HOSPITAL_COMMUNITY): Payer: BC Managed Care – PPO

## 2015-11-07 ENCOUNTER — Encounter (HOSPITAL_COMMUNITY): Payer: Self-pay | Admitting: Emergency Medicine

## 2015-11-07 ENCOUNTER — Emergency Department (HOSPITAL_COMMUNITY)
Admission: EM | Admit: 2015-11-07 | Discharge: 2015-11-08 | Disposition: A | Discharge status: Home / Self Care | Payer: BC Managed Care – PPO | Attending: Emergency Medicine | Admitting: Emergency Medicine

## 2015-11-07 DIAGNOSIS — I10 Essential (primary) hypertension: Secondary | ICD-10-CM | POA: Diagnosis not present

## 2015-11-07 DIAGNOSIS — R0789 Other chest pain: Secondary | ICD-10-CM | POA: Diagnosis not present

## 2015-11-07 DIAGNOSIS — K219 Gastro-esophageal reflux disease without esophagitis: Secondary | ICD-10-CM | POA: Insufficient documentation

## 2015-11-07 DIAGNOSIS — I2510 Atherosclerotic heart disease of native coronary artery without angina pectoris: Secondary | ICD-10-CM | POA: Diagnosis not present

## 2015-11-07 DIAGNOSIS — Z79899 Other long term (current) drug therapy: Secondary | ICD-10-CM | POA: Insufficient documentation

## 2015-11-07 DIAGNOSIS — Z85818 Personal history of malignant neoplasm of other sites of lip, oral cavity, and pharynx: Secondary | ICD-10-CM | POA: Diagnosis not present

## 2015-11-07 DIAGNOSIS — R079 Chest pain, unspecified: Secondary | ICD-10-CM | POA: Diagnosis not present

## 2015-11-07 DIAGNOSIS — E039 Hypothyroidism, unspecified: Secondary | ICD-10-CM | POA: Diagnosis not present

## 2015-11-07 DIAGNOSIS — Z87891 Personal history of nicotine dependence: Secondary | ICD-10-CM | POA: Diagnosis not present

## 2015-11-07 LAB — I-STAT TROPONIN, ED: Troponin i, poc: 0 ng/mL (ref 0.00–0.08)

## 2015-11-07 LAB — BASIC METABOLIC PANEL
Anion gap: 14 (ref 5–15)
BUN: 10 mg/dL (ref 6–20)
CHLORIDE: 100 mmol/L — AB (ref 101–111)
CO2: 21 mmol/L — AB (ref 22–32)
CREATININE: 1.2 mg/dL (ref 0.61–1.24)
Calcium: 8.9 mg/dL (ref 8.9–10.3)
GFR calc Af Amer: >60 mL/min (ref >60)
GFR calc non Af Amer: >60 mL/min (ref >60)
GLUCOSE: 111 mg/dL — AB (ref 65–99)
Potassium: 3.9 mmol/L (ref 3.5–5.1)
SODIUM: 135 mmol/L (ref 135–145)

## 2015-11-07 LAB — CBC
HCT: 40.4 % (ref 39.0–52.0)
Hemoglobin: 13.7 g/dL (ref 13.0–17.0)
MCH: 31.9 pg (ref 26.0–34.0)
MCHC: 33.9 g/dL (ref 30.0–36.0)
MCV: 94 fL (ref 78.0–100.0)
PLATELETS: 258 10*3/uL (ref 150–400)
RBC: 4.3 MIL/uL (ref 4.22–5.81)
RDW: 13 % (ref 11.5–15.5)
WBC: 6.2 10*3/uL (ref 4.0–10.5)

## 2015-11-07 MED ORDER — GI COCKTAIL ~~LOC~~
30.0000 mL | Freq: Once | ORAL | Status: AC
  Administered 2015-11-08: 30 mL via ORAL
  Filled 2015-11-07: qty 30

## 2015-11-07 NOTE — ED Notes (Signed)
Patient with chest pain that started earlier this afternoon.  Patient does have some nausea, no vomiting.  Patient denies any shortness of breath at this time.

## 2015-11-07 NOTE — ED Provider Notes (Signed)
CSN: [id number]     Arrival date & time 11/07/15  2201 History   By signing my name below, I, Forrestine Him, attest that this documentation has been prepared under the direction and in the presence of Jola Schmidt, MD.  Electronically Signed: Forrestine Him, ED Scribe. 11/07/2015. 11:18 PM.   Chief Complaint  Patient presents with  . Chest Pain   The history is provided by the patient. No language interpreter was used.    HPI Comments: Samuel Leon is a 60 y.o. male with a PMHx of cancer, HTN, and CAD who presents to the Emergency Department complaining of intermittent, ongoing L sided lower chest pain onset earlier this afternoon while at a barbeque. Pain is described as indigestion with episodes lasting approximately 30 minutes at a time. Pt also reports mild nausea during episode of chest pain along with intermittent night sweats throughout the past week. No aggravating or alleviating factors reported. No OTC medications or home remedies attempted prior to arrival. No recent fever, chills, vomiting, abdominal pain, diaphoresis, cough, or shortness of breath. No leg swelling. Pt admits to having a couple of beers during his time at the barbeque this afternoon. Wife states pt has reported similar chest pain in the past. However, pt denies ever going for follow up for chest discomfort. Father is 69 years old with a history of CHF and stent placement. No personal history of blood clots. No prior history of an echocardiogram or heart catheterization, however, pt underwent a stress test 4 years ago with Dr. Daneen Schick.  PCP: Beatris Si    Past Medical History  Diagnosis Date  . Cancer ([place])     tonsillar ca  . Hypothyroid   . Weight loss   . HTN (hypertension)   . CAD (coronary artery disease)   . Other fatigue 04/27/2015  . History of cancer tonsil 04/27/2015   History reviewed. No pertinent past surgical history. Family History  Problem Relation Age of Onset  . Heart attack  Mother   . Cancer Paternal Aunt     breast ca   Social History  Substance Use Topics  . Smoking status: Former Smoker -- 0.30 packs/day for 1 years    Types: Cigarettes    Quit date: 07/04/1968  . Smokeless tobacco: Never Used  . Alcohol Use: Yes    Review of Systems  Constitutional: Negative for fever and chills.  Respiratory: Negative for cough and shortness of breath.   Cardiovascular: Positive for chest pain.  Gastrointestinal: Positive for nausea. Negative for vomiting and abdominal pain.  Musculoskeletal: Negative for back pain.  Neurological: Negative for headaches.  Psychiatric/Behavioral: Negative for confusion.  All other systems reviewed and are negative.     Allergies  Aspirin  Home Medications   Prior to Admission medications   Medication Sig Start Date End Date Taking? Authorizing Provider  ALPRAZolam Duanne Moron) 0.5 MG tablet Take 0.25 mg by mouth 4 (four) times daily as needed.     Historical Provider, MD  amLODipine-benazepril (LOTREL) 5-20 MG capsule Take 1 capsule by mouth daily.    Historical Provider, MD  levothyroxine (SYNTHROID, LEVOTHROID) 100 MCG tablet Take 100 mcg by mouth daily before breakfast.    Historical Provider, MD  Multiple Vitamin (MULTIVITAMIN) tablet Take 1 tablet by mouth daily.    Historical Provider, MD  NONFORMULARY OR COMPOUNDED ITEM [employer] - meloxi/doxep/amanta/dxm/lidocaine (25) 1-2 pumps 3-4 times daily on hands for neuropathy    Historical Provider, MD  propranolol (INDERAL) 60  MG tablet Take 60 mg by mouth daily.    Historical Provider, MD  temazepam (RESTORIL) 15 MG capsule Take 15 mg by mouth at bedtime as needed.    Historical Provider, MD  vitamin B-12 (CYANOCOBALAMIN) 1000 MCG tablet Take 1,000 mcg by mouth daily.    Historical Provider, MD   Triage Vitals: BP 120/86 mmHg  Pulse 72  Temp(Src) 97.5 F (36.4 C) (Oral)  Resp 16  SpO2 98%   Physical Exam  Constitutional: He is oriented to person,  place, and time. He appears well-developed and well-nourished.  HENT:  Head: Normocephalic and atraumatic.  Eyes: EOM are normal.  Neck: Normal range of motion.  Cardiovascular: Normal rate, regular rhythm, normal heart sounds and intact distal pulses.   Pulmonary/Chest: Effort normal and breath sounds normal. No respiratory distress.  Abdominal: Soft. He exhibits no distension. There is no tenderness.  Musculoskeletal: Normal range of motion.  Neurological: He is alert and oriented to person, place, and time.  Skin: Skin is warm and dry.  Psychiatric: He has a normal mood and affect. Judgment normal.  Nursing note and vitals reviewed.   ED Course  Procedures (including critical care time)  DIAGNOSTIC STUDIES: Oxygen Saturation is 98% on RA, Normal by my interpretation.    COORDINATION OF CARE: 11:10 PM- Will order CXR, blood work, and EKG. Discussed treatment plan with pt at bedside and pt agreed to plan.     Labs Review Labs Reviewed  BASIC METABOLIC PANEL - Abnormal; Notable for the following:    Chloride 100 (*)    CO2 21 (*)    Glucose, Bld 111 (*)    All other components within normal limits  CBC  I-STAT TROPOININ, ED  Randolm Idol, ED    Imaging Review Dg Chest 2 View  11/07/2015  CLINICAL DATA:  Centralized chest pain starting today. History of tonsillar cancer, coronary artery disease, hypertension EXAM: CHEST  2 VIEW COMPARISON:  03/13/2012 FINDINGS: Shallow inspiration with linear atelectasis or fibrosis in the lung bases. Normal heart size and pulmonary vascularity. No focal airspace disease or consolidation in the lungs. No blunting of costophrenic angles. No pneumothorax. Mediastinal contours appear intact. Degenerative changes in the spine with multiple anterior wedge deformities of thoracic vertebrae. No change since prior study. IMPRESSION: Linear atelectasis or fibrosis in the lung bases. No focal consolidation. Electronically Signed   By: Lucienne Capers  M.D.   On: 11/07/2015 22:46   I have personally reviewed and evaluated these images and lab results as part of my medical decision-making.   EKG Interpretation #1  Date/Time:  Saturday Nov 07 2015 22:07:21 EDT Ventricular Rate:  74 PR Interval:  154 QRS Duration: 88 QT Interval:  374 QTC Calculation: 415 R Axis:   74 Text Interpretation:  Normal sinus rhythm Normal ECG No old tracing to  compare Confirmed by Tevita Gomer  MD, Latasha Buczkowski ([id number]) on 11/07/2015 11:18:57 PM    EKG Interpretation #2  Date/Time:  Sunday Nov 08 2015 00:17:35 EDT Ventricular Rate:  71 PR Interval:  158 QRS Duration: 89 QT Interval:  394 QTC Calculation: 428 R Axis:   79 Text Interpretation:  Sinus rhythm  No significant change was found Confirmed by Melisha Eggleton  MD, Florance Paolillo ([id number]) on 11/08/2015 12:31:03 AM            MDM   Final diagnoses:  Chest pain, unspecified chest pain type  Gastroesophageal reflux disease without esophagitis    2:10 AM Overall well-appearing.  Vital signs are normal.  Patient feels much better after IV fluids.  One documented pulse oximetry of 89% is an error.  Doubt PE.  Doubt ACS.  EKG and troponin negative 2.  Feels much better after GI cocktail.  Suspect gastroesophageal reflux disease.  Home on Prilosec.  No indication for additional workup.  Outpatient primary care follow-up  I personally performed the services described in this documentation, which was scribed in my presence. The recorded information has been reviewed and is accurate.       Jola Schmidt, MD 11/08/15 [phone]

## 2015-11-08 DIAGNOSIS — R079 Chest pain, unspecified: Secondary | ICD-10-CM | POA: Diagnosis not present

## 2015-11-08 LAB — I-STAT TROPONIN, ED: TROPONIN I, POC: 0 ng/mL (ref 0.00–0.08)

## 2015-11-08 MED ORDER — OMEPRAZOLE 20 MG PO CPDR: 20.0000 mg | DELAYED_RELEASE_CAPSULE | Freq: Every day | ORAL | Status: DC

## 2015-11-08 NOTE — ED Notes (Signed)
Pt placed on 2L o2 while sleeping.

## 2015-11-08 NOTE — ED Notes (Signed)
Pt sleeping and 02 stats dropped to 82% on room air, pt was snoring and when awakened stats quickly returned to 97%, pt states that he has never been told that he has sleep apnea.

## 2015-11-08 NOTE — Discharge Instructions (Signed)

## 2015-12-01 ENCOUNTER — Telehealth: Payer: Self-pay | Admitting: Adult Health

## 2015-12-01 NOTE — Telephone Encounter (Signed)
I attempted to reach Mr. Bhuiyan to follow-up on how he is feeling since seeing Dr. Redmond Baseman on 11/02/15 for dizziness/balance disturbance.  In reviewing Dr. Redmond Baseman' notes, he recommended the patient start vestibular rehab.  I was calling to follow-up with the patient to see how he is feeling.  I asked that he return my call when he is able. Awaiting return call.   Mike Craze, NP [place] [phone]

## 2015-12-01 NOTE — Telephone Encounter (Signed)
Mr. Colona returned my call and tells me that he is doing well; no new complaints.  He has not started the vestibular rehab recommended by Dr. Redmond Baseman because he spends most of the summer at the beach condo at [facility], which is "my therapy for my mind."  He tells me that he is still dealing with the "same aches & pains", hearing loss, and the balance issues.  He attributes a lot of these symptoms to his cancer treatments.  He is planning on doing the vestibular rehab when he returns back to [place].  I discussed the option of getting brain imaging if his symptoms do not improve with the rehab and/or if he has any new or worsening symptoms.  I discussed with him that I would plan on seeing him in about 6 months to follow-up on how he is doing and he agreed with this plan.   I will make this appointment and mail it to him, per his request.  I encouraged him to call me with any questions or concerns in the meantime and I would be happy to see him earlier, if needed.   Mike Craze, NP [place] [phone]

## 2015-12-30 DIAGNOSIS — Z0000 Encounter for general adult medical examination without abnormal findings: Secondary | ICD-10-CM | POA: Diagnosis not present

## 2016-06-07 ENCOUNTER — Telehealth: Payer: Self-pay | Admitting: *Deleted

## 2016-06-07 NOTE — Telephone Encounter (Signed)
"  I need to make an appointment with Samuel Leon if she is still there.  When did I see her last?"   10-28-2015 was last visit.   "Thank you for being very helpful.  I need to see her again within a year so I'll call back the first of the year."

## 2016-06-08 ENCOUNTER — Telehealth: Payer: Self-pay | Admitting: *Deleted

## 2016-06-08 NOTE — Telephone Encounter (Signed)
Called and spoke to pt gave him the dates when he last saw Dr. Alvy Bimler and last saw NP. He said this should be sufficient for his insurance. No further concerns. Message to be fwd to [employer].

## 2016-09-14 DIAGNOSIS — E039 Hypothyroidism, unspecified: Secondary | ICD-10-CM | POA: Diagnosis not present

## 2016-09-14 DIAGNOSIS — R251 Tremor, unspecified: Secondary | ICD-10-CM | POA: Diagnosis not present

## 2016-09-14 DIAGNOSIS — G4700 Insomnia, unspecified: Secondary | ICD-10-CM | POA: Diagnosis not present

## 2016-09-14 DIAGNOSIS — F419 Anxiety disorder, unspecified: Secondary | ICD-10-CM | POA: Diagnosis not present

## 2016-09-14 DIAGNOSIS — I119 Hypertensive heart disease without heart failure: Secondary | ICD-10-CM | POA: Diagnosis not present

## 2016-12-07 ENCOUNTER — Encounter: Payer: Self-pay | Admitting: Radiation Oncology

## 2016-12-07 ENCOUNTER — Telehealth: Payer: Self-pay | Admitting: *Deleted

## 2016-12-07 NOTE — Telephone Encounter (Signed)
Oncology Nurse Navigator Documentation  Spoke with Samuel Leon, informed him Dr. Isidore Moos spoke with his dentist Dr. Leonides Schanz in regard to proposed tooth extraction.  He confirmed understanding, indicated he will be coming to [facility] tomorrow morning to sign Information Release.  [name], RN, BSN, [place] Oncology Nurse [place] [facility] [place] [phone]

## 2016-12-07 NOTE — Progress Notes (Signed)
Patient's dentist, Dr Leonides Schanz, called to inform me that patient needs tooth #14 removed. I reviewed Dr. Unknown Jim RT plan from 2010.  That tooth/root was easily outside the 30Gy line.  Therefore, I think this is a relatively low risk area from a prior RT dose standpoint for extraction. I defer to Dr Leonides Schanz as to whether he wants to refer to an oral surgeon or pull the tooth himself.  I don't recommend prophylactic HBO.  -----------------------------------  Eppie Gibson, MD

## 2016-12-14 ENCOUNTER — Telehealth: Payer: Self-pay | Admitting: Hematology and Oncology

## 2016-12-14 NOTE — Telephone Encounter (Signed)
September schedule mailed

## 2017-02-28 ENCOUNTER — Telehealth: Payer: Self-pay | Admitting: *Deleted

## 2017-02-28 NOTE — Telephone Encounter (Signed)
"  Learned of an appointment March 14, 2017 at 1:00 pm.  Could you tell me the nature of this appointment, who I will see and why?  This came up all of a sudden.  I have an address there but living at [facility].  I've seen five different oncologist there so I need to make sure this appointment was scheduled correctly."  Scheduled on 03-14-2017 at 1:00 pm for a 30-minute follow up appointment with Dr. Alvy Bimler.  No lab.  Appointment was scheduled in June.  This nurse unable to view scheduling order information.  Message left for scheduler for further evaluation. Survivorship ordered with 04-27-2015 visit with Dr. Alvy Bimler.  Seen last by Mike Craze on 10-28-2015.   "I do not mind a F/U appointment.  Unless I hear differently, I will come in 03-14-2017 at 1:00 pm as scheduled.  Return number [phone]."  Routing call information to scheduler, collaborative nurse and provider for review.  Further patient communication through collaborative nurse.

## 2017-03-03 NOTE — Telephone Encounter (Signed)
Left message for patient that this just a follow up appt with Dr Alvy Bimler

## 2017-03-03 NOTE — Telephone Encounter (Signed)
Samuel Leon said in her 10/28/15 note she would see him in a year.  I suspect this is a survivorship appt that has been assigned to Dr. Alvy Bimler.  Both she and Dr. Isidore Moos have taken on Samuel Leon's appts and there has been some delay in getting appts in the originally anticipated time frame.

## 2017-03-14 ENCOUNTER — Ambulatory Visit (HOSPITAL_BASED_OUTPATIENT_CLINIC_OR_DEPARTMENT_OTHER): Payer: Medicare Other | Admitting: Hematology and Oncology

## 2017-03-14 ENCOUNTER — Encounter: Payer: Self-pay | Admitting: Hematology and Oncology

## 2017-03-14 VITALS — BP 165/95 | HR 74 | Temp 98.2°F | Resp 16 | Ht 70.0 in | Wt 193.9 lb

## 2017-03-14 DIAGNOSIS — R259 Unspecified abnormal involuntary movements: Secondary | ICD-10-CM | POA: Diagnosis not present

## 2017-03-14 DIAGNOSIS — Z85818 Personal history of malignant neoplasm of other sites of lip, oral cavity, and pharynx: Secondary | ICD-10-CM

## 2017-03-14 DIAGNOSIS — I10 Essential (primary) hypertension: Secondary | ICD-10-CM

## 2017-03-14 DIAGNOSIS — E039 Hypothyroidism, unspecified: Secondary | ICD-10-CM

## 2017-03-14 DIAGNOSIS — G609 Hereditary and idiopathic neuropathy, unspecified: Secondary | ICD-10-CM

## 2017-03-14 DIAGNOSIS — G252 Other specified forms of tremor: Secondary | ICD-10-CM

## 2017-03-14 DIAGNOSIS — R110 Nausea: Secondary | ICD-10-CM | POA: Diagnosis not present

## 2017-03-14 NOTE — Assessment & Plan Note (Signed)
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

## 2017-03-14 NOTE — Assessment & Plan Note (Addendum)
Clinically, he has no signs of disease. I told him there is no substitute for ENT follow-up. The patient is considered a long-term cancer survivor. He does not need further return follow-up visit We discussed risk factors for cancer recurrence such as smoking and alcoholism We also discussed other preventive health strategies including Prevnar 13 and influenza vaccination

## 2017-03-14 NOTE — Assessment & Plan Note (Signed)
He has very profound regular nausea and vomiting He has not had screening colonoscopy I recommend GI consult for EGD and colonoscopy

## 2017-03-14 NOTE — Assessment & Plan Note (Signed)
He has severe persistent resting tremor that is not normal I recommend second opinion with a different neurology group and he agreed

## 2017-03-14 NOTE — Assessment & Plan Note (Signed)
He is taking his thyroid supplement as prescribed I recommend he gets TSH checked on a yearly basis He has appointment to see his primary care doctor next week

## 2017-03-14 NOTE — Progress Notes (Signed)
[facility] OFFICE PROGRESS NOTE  Patient Care Team: [name], PA-C as PCP - General (Physician Assistant) Heath Lark, MD as Consulting Physician (Hematology and Oncology) Melida Quitter, MD as Consulting Physician (Otolaryngology)  SUMMARY OF ONCOLOGIC HISTORY:   Squamous cell carcinoma of right tonsil [facility])   10/2008 Miscellaneous    Presented with right neck mass to his PCP; treated with antibiotics and did not improve. Referred to Dr. Redmond Baseman (ENT)      11/03/2008 Imaging    CT head/neck.  Multiple enlarged necrotic level 2 & level 3 LNs measuring 2 cm.  No left neck adenopathy. Fullness noted in right pharyngeal tonsil, which could be due to primary neoplasm.       11/06/2008 Procedure    Right cervical level 2 lymph node FNA. Path revealed atypical epithelioid cells associated with kerratin.       11/08/2008 Imaging    CT neck: Stable necrotic right neck adenopathy. Stable mild thickening along right pharyngeal tonsillar region without discrete enhancing mass or abscess.       11/2008 Initial Diagnosis    Squamous cell carcinoma of right tonsil ([place])      11/03/2008 Clinical Stage    T1, N2b       11/19/2008 Surgery    Right tonsillectomy Redmond Baseman). Path revealed invasive squamous cell carcinoma, measuring 1.5 cm, focal (+) margin. Right BOT and nasopharynx also biopsied and were negative for malignancy. LNs not sampled. Strongly p16 (+).       11/19/2008 Pathologic Stage    pT1, pNx      12/16/2008 PET scan    Hypermetabolic (R)-sided cervical adenopathy consistent with localized nodal metastasis. No evidence of extra cervical disease.  Hypermetabolism along superior aspect of right mandible; likely postoperative given concurrent gas in this area.       01/12/2009 - 03/03/2009 Radiation Therapy    Tomo/IMRT, parotid-sparing Pablo Ledger). Right tonsillar fossa: Total dose 70 Gy in 35 fractions. 63 Gy in 35 fx to high-risk nodal echelons. 56 Gy in 35 fx to  intermediate-risk nodal echelons.       01/12/2009 - 02/02/2009 Chemotherapy    Concurrent chemo-radiation therapy (Ha). Received 2 doses of Cisplatin 100 mg/m2.       03/31/2009 Imaging    CT neck: Significant decrease in right neck adenopathy since previous scan. No new mass or adenopathy.       06/02/2009 PET scan    Response to therapy without evidence of residual hypermetabolic cervical LNs. No evidence of extra cervical disease.       09/09/2009 Imaging    CT H&N: Normal CT appearance of brain for age. Increase sequelae of radiation therapy including small retropharyngeal effusion & diffuse pharyngeal mucosal space thickening/edema. Stable residual (R) neck LNs. No discrete throat mass or lymphadenopathy       INTERVAL HISTORY: Please see below for problem oriented charting. He returns for further follow-up He has not seen ENT for a year He has poor baseline hearing but has not have a hearing aid He had regular nausea and vomiting on a daily basis He has severe resting tremor and severe peripheral neuropathy to the point he cannot feel his legs, with very unsteady gait and frequent falls He has not seen a neurologist for 3 years He has occasional headaches No recent infection Denies any dysphagia or new neck mass  REVIEW OF SYSTEMS:   Constitutional: Denies fevers, chills or abnormal weight loss Eyes: Denies blurriness of vision Ears, nose, mouth, throat,  and face: Denies mucositis or sore throat Respiratory: Denies cough, dyspnea or wheezes Cardiovascular: Denies palpitation, chest discomfort or lower extremity swelling Skin: Denies abnormal skin rashes Lymphatics: Denies new lymphadenopathy or easy bruising Behavioral/Psych: Mood is stable, no new changes  All other systems were reviewed with the patient and are negative.  I have reviewed the past medical history, past surgical history, social history and family history with the patient and they are unchanged from  previous note.  ALLERGIES:  is allergic to aspirin.  MEDICATIONS:  Current Outpatient Prescriptions  Medication Sig Dispense Refill  . ALPRAZolam (XANAX) 0.5 MG tablet Take 0.25 mg by mouth 4 (four) times daily as needed for anxiety.     Marland Kitchen amLODipine-benazepril (LOTREL) 5-20 MG capsule Take 1 capsule by mouth daily.    Marland Kitchen levothyroxine (SYNTHROID, LEVOTHROID) 100 MCG tablet Take 100 mcg by mouth daily before breakfast.    . Multiple Vitamin (MULTIVITAMIN) tablet Take 1 tablet by mouth daily.    . NONFORMULARY OR COMPOUNDED ITEM [employer] - meloxi/doxep/amanta/dxm/lidocaine (25) 1-2 pumps 3-4 times daily on hands for neuropathy    . omeprazole (PRILOSEC) 20 MG capsule Take 1 capsule (20 mg total) by mouth daily. 30 capsule 0  . propranolol (INDERAL) 60 MG tablet Take 60 mg by mouth daily.    . temazepam (RESTORIL) 15 MG capsule Take 15 mg by mouth at bedtime as needed for sleep.     . vitamin B-12 (CYANOCOBALAMIN) 1000 MCG tablet Take 1,000 mcg by mouth daily.     No current facility-administered medications for this visit.     PHYSICAL EXAMINATION: ECOG PERFORMANCE STATUS: 2 - Symptomatic, <50% confined to bed  Vitals:   03/14/17 1234  BP: (!) 165/95  Pulse: 74  Resp: 16  Temp: 98.2 F (36.8 C)  SpO2: 98%   Filed Weights   03/14/17 1234  Weight: 193 lb 14.4 oz (88 kg)    GENERAL:alert, no distress and comfortable SKIN: skin color, texture, turgor are normal, no rashes or significant lesions EYES: normal, Conjunctiva are pink and non-injected, sclera clear OROPHARYNX:no exudate, no erythema and lips, buccal mucosa, and tongue normal  NECK: Neck is harder than from prior radiation induced fibrosis  LYMPH:  no palpable lymphadenopathy in the cervical, axillary or inguinal LUNGS: clear to auscultation and percussion with normal breathing effort HEART: regular rate & rhythm and no murmurs and no lower extremity edema ABDOMEN:abdomen soft, non-tender and  normal bowel sounds Musculoskeletal:no cyanosis of digits and no clubbing  NEURO: alert & oriented x 3 with fluent speech, noted significant resting tremor and very poor gait stability  LABORATORY DATA:  I have reviewed the data as listed    Component Value Date/Time   NA 135 11/07/2015 2213   NA 139 04/27/2015 1147   K 3.9 11/07/2015 2213   K 4.1 04/27/2015 1147   CL 100 (L) 11/07/2015 2213   CL 96 (L) 10/30/2012 0837   CO2 21 (L) 11/07/2015 2213   CO2 23 04/27/2015 1147   GLUCOSE 111 (H) 11/07/2015 2213   GLUCOSE 99 04/27/2015 1147   GLUCOSE 101 (H) 10/30/2012 0837   BUN 10 11/07/2015 2213   BUN 14.1 04/27/2015 1147   CREATININE 1.20 11/07/2015 2213   CREATININE 1.2 04/27/2015 1147   CALCIUM 8.9 11/07/2015 2213   CALCIUM 9.7 04/27/2015 1147   PROT 7.5 04/27/2015 1147   ALBUMIN 4.2 04/27/2015 1147   AST 35 (H) 04/27/2015 1147   ALT 39 04/27/2015 1147   ALKPHOS  59 04/27/2015 1147   BILITOT 0.67 04/27/2015 1147   GFRNONAA >60 11/07/2015 2213   GFRAA >60 11/07/2015 2213    No results found for: SPEP, UPEP  Lab Results  Component Value Date   WBC 6.2 11/07/2015   NEUTROABS 4.7 04/27/2015   HGB 13.7 11/07/2015   HCT 40.4 11/07/2015   MCV 94.0 11/07/2015   PLT 258 11/07/2015      Chemistry      Component Value Date/Time   NA 135 11/07/2015 2213   NA 139 04/27/2015 1147   K 3.9 11/07/2015 2213   K 4.1 04/27/2015 1147   CL 100 (L) 11/07/2015 2213   CL 96 (L) 10/30/2012 0837   CO2 21 (L) 11/07/2015 2213   CO2 23 04/27/2015 1147   BUN 10 11/07/2015 2213   BUN 14.1 04/27/2015 1147   CREATININE 1.20 11/07/2015 2213   CREATININE 1.2 04/27/2015 1147      Component Value Date/Time   CALCIUM 8.9 11/07/2015 2213   CALCIUM 9.7 04/27/2015 1147   ALKPHOS 59 04/27/2015 1147   AST 35 (H) 04/27/2015 1147   ALT 39 04/27/2015 1147   BILITOT 0.67 04/27/2015 1147      ASSESSMENT & PLAN:  History of cancer tonsil Clinically, he has no signs of disease. I told him  there is no substitute for ENT follow-up. The patient is considered a long-term cancer survivor. 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 The patient has fallen multiple times and have very unsteady gait I told him this is not consistent with chemotherapy-induced peripheral neuropathy and recommend further neurology consult and he agreed to be referred  Hypothyroidism (acquired) He is taking his thyroid supplement as prescribed I recommend he gets TSH checked on a yearly basis He has appointment to see his primary care doctor next week  Essential hypertension His blood pressure is very high I recommend close follow-up with primary care doctor for medication adjustment  Nausea without vomiting He has very profound regular nausea and vomiting He has not had screening colonoscopy I recommend GI consult for EGD and colonoscopy   Orders Placed This Encounter  Procedures  . Ambulatory referral to Neurology    Referral Priority:   Routine    Referral Type:   Consultation    Referral Reason:   Specialty Services Required    Requested Specialty:   Neurology    Number of Visits Requested:   1   All questions were answered. The patient knows to call the clinic with any problems, questions or concerns. No barriers to learning was detected. I spent 15 minutes counseling the patient face to face. The total time spent in the appointment was 20 minutes and more than 50% was on counseling and review of test results     Heath Lark,  MD 03/14/2017 2:20 PM

## 2017-03-14 NOTE — Assessment & Plan Note (Signed)
His blood pressure is very high I recommend close follow-up with primary care doctor for medication adjustment

## 2017-03-15 ENCOUNTER — Telehealth: Payer: Self-pay | Admitting: Hematology and Oncology

## 2017-03-15 NOTE — Telephone Encounter (Signed)
Per 9/11 no los °

## 2017-03-21 DIAGNOSIS — Z79899 Other long term (current) drug therapy: Secondary | ICD-10-CM | POA: Diagnosis not present

## 2017-03-21 DIAGNOSIS — I119 Hypertensive heart disease without heart failure: Secondary | ICD-10-CM | POA: Diagnosis not present

## 2017-03-21 DIAGNOSIS — E785 Hyperlipidemia, unspecified: Secondary | ICD-10-CM | POA: Diagnosis not present

## 2017-03-21 DIAGNOSIS — Z23 Encounter for immunization: Secondary | ICD-10-CM | POA: Diagnosis not present

## 2017-03-21 DIAGNOSIS — Z1159 Encounter for screening for other viral diseases: Secondary | ICD-10-CM | POA: Diagnosis not present

## 2017-03-21 DIAGNOSIS — Z0000 Encounter for general adult medical examination without abnormal findings: Secondary | ICD-10-CM | POA: Diagnosis not present

## 2017-03-21 DIAGNOSIS — E039 Hypothyroidism, unspecified: Secondary | ICD-10-CM | POA: Diagnosis not present

## 2017-05-04 NOTE — Progress Notes (Signed)
Samuel Leon was seen today in the movement disorders clinic for neurologic consultation at the request of Heath Lark, MD.  The consultation is for the evaluation of tremor and gait change/PN.  This patient is accompanied in the office by his his wife (separated) who supplements the history.    Tremor: Yes.     How long has it been going on? mentioned it to Dr. Leta Baptist in 11/2012.  Pt states that it started after chemo/radiation in 2010  At rest or with activation?  More with activation  When is it noted the most?  Writing/eatingh  Fam hx of tremor?  Yes.  , father but they attribute to CHF and other health issues  Located where?  Hands, bilateral - started with R more than L  Affected by caffeine:  No. (doesn't drink much caffeine)  Affected by alcohol:  No. (drinks "some" alcohol wine/beer daily - cannot tell me how much even when probed)  Affected by stress:  Yes.    Affected by fatigue:  unknown  Spills soup if on spoon: may or may not  Spills glass of liquid if full:  May or may not  Affects ADL's (tying shoes, brushing teeth, etc):  No. , but may shave with both hands.  Trouble with buttoning shirts but that is from neuropathy   Any other sx's: Voice: softer than in the past Sleep: takes restoril (2 tablets) near nightly but still trouble staying asleep  Vivid Dreams:  Yes.    Acting out dreams:  No. Wet Pillows: No. Postural symptoms:  Yes.    Falls?  Yes.  , 1-2 times per week (uses cane); never did PT for balance Bradykinesia symptoms: difficulty getting out of a chair Loss of smell:  No. Loss of taste:  Yes.   Urinary Incontinence:  Has urinary frequency and nocturia Difficulty Swallowing:  No. Depression:  Yes.  , has appt with psychologist (he just broke up with his girlfriend per wife - she told me when he left room to go to the bathroom) Memory changes:  Yes.   N/V:  Yes.  , has "morning sickness" Lightheaded:  rarely  Syncope: No. Diplopia:   No. Dyskinesia:  No.  Describes lhermittes phenomenon for many years when flexes neck and gets pain in hands and legs   PREVIOUS MEDICATIONS: cymbalta (made feel "weird" that they have trouble stating what meant and they also think that he started drinking once quit "cold [place]"); gabapentin (300 mg bid and no help)  ALLERGIES:   Allergies  Allergen Reactions  . Aspirin Nausea Only    CURRENT MEDICATIONS:  Outpatient Encounter Medications as of 05/08/2017  Medication Sig  . ALPRAZolam (XANAX) 0.5 MG tablet Take 0.25 mg by mouth 4 (four) times daily as needed for anxiety.   Marland Kitchen amLODipine-benazepril (LOTREL) 5-20 MG capsule Take 1 capsule by mouth daily.  Marland Kitchen levothyroxine (SYNTHROID, LEVOTHROID) 100 MCG tablet Take 100 mcg by mouth daily before breakfast.  . Multiple Vitamin (MULTIVITAMIN) tablet Take 1 tablet by mouth daily.  . NONFORMULARY OR COMPOUNDED ITEM [employer] - meloxi/doxep/amanta/dxm/lidocaine (25) 1-2 pumps 3-4 times daily on hands for neuropathy  . propranolol (INDERAL) 60 MG tablet Take 60 mg every other day by mouth.   . temazepam (RESTORIL) 15 MG capsule Take 15 mg by mouth at bedtime as needed for sleep.   . vitamin B-12 (CYANOCOBALAMIN) 1000 MCG tablet Take 1,000 mcg by mouth daily.  . [DISCONTINUED] omeprazole (PRILOSEC) 20 MG capsule  Take 1 capsule (20 mg total) by mouth daily.   No facility-administered encounter medications on file as of 05/08/2017.     PAST MEDICAL HISTORY:   Past Medical History:  Diagnosis Date  . CAD (coronary artery disease)   . 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 History of cancer tonsil 04/27/2015  . HTN (hypertension)   . Hypothyroid   . Other fatigue 04/27/2015  . Renal insufficiency   . Weight loss     PAST SURGICAL HISTORY:   Past Surgical History:  Procedure Laterality Date  . arm surgery     as a child  . MANDIBLE SURGERY    . TONSILLECTOMY     tonsilar cancer    SOCIAL HISTORY:   Social History    Socioeconomic History  . Marital status: Married    Spouse name: Jerrye Beavers  . Number of children: 1  . Years of education: HS  . Highest education level: Not on file  Social Needs  . Financial resource strain: Not on file  . Food insecurity - worry: Not on file  . Food insecurity - inability: Not on file  . Transportation needs - medical: Not on file  . Transportation needs - non-medical: Not on file  Occupational History  . Occupation: disabled    Comment: Engineer, petroleum  Tobacco Use  . Smoking status: Former Smoker    Packs/day: 0.30    Years: 1.00    Pack years: 0.30    Types: Cigarettes    Last attempt to quit: 07/04/1968    Years since quitting: 48.8  . Smokeless tobacco: Never Used  Substance and Sexual Activity  . Alcohol use: Yes    Comment: 3-4 days a week  . Drug use: No  . Sexual activity: Not on file  Other Topics Concern  . Not on file  Social History Narrative   Patient lives at home with family.   Caffeine Use: 1-2 cups daily    FAMILY HISTORY:   Family Status  Relation Name Status  . Mother  Deceased  . Father  Alive  . Ethlyn Daniels  (Not Specified)  . Sister  Deceased  . Brother Social research officer, government  . Sister x2 Alive  . Son  Alive    ROS:  A complete 10 system review of systems was obtained and was unremarkable apart from what is mentioned above.  PHYSICAL EXAMINATION:    VITALS:   Vitals:   05/08/17 1000  BP: 124/88  Pulse: 84  SpO2: 95%  Weight: 197 lb (89.4 kg)  Height: 5\' 10"  (1.778 m)    GEN:  The patient appears stated age and is in NAD. HEENT:  Normocephalic, atraumatic.  The mucous membranes are moist. The superficial temporal arteries are without ropiness or tenderness. CV:  RRR Lungs:  CTAB Neck/HEME:  There are no carotid bruits bilaterally.  Neurological examination:  Orientation: The patient is alert and oriented x3. Fund of knowledge is appropriate.  Recent and remote memory are intact.  Attention and concentration are normal.     Able to name objects and repeat phrases. Cranial nerves: There is good facial symmetry. Pupils are equal round and reactive to light bilaterally. Fundoscopic exam reveals clear margins bilaterally. Extraocular muscles are intact. The visual fields are full to confrontational testing. The speech is fluent and clear. Soft palate rises symmetrically and there is no tongue deviation. Hearing is intact to conversational tone. Sensation: Sensation is intact to light and pinprick throughout (facial, trunk,  extremities). Vibration is intact at the bilateral big toe. There is no extinction with double simultaneous stimulation. There is no sensory dermatomal level identified. Motor: Strength is 5/5 in the bilateral upper extremities with encouragement with the exception of intrinsic mm of the hand and strength there is 4/5.  Strength in the LE is at least 3+-4/5 but he has diffuse give way weakness proximally and distally that is somewhat better with encouragement.     Shoulder shrug is equal and symmetric.  There is no pronator drift. Deep tendon reflexes: Deep tendon reflexes are 2+/4 at the bilateral biceps, triceps, brachioradialis, patella and 1/4 at the bilateral achilles.  There are pre-patellar reflexes on the L.   Plantar responses are downgoing bilaterally.  Movement examination: Tone: There is normal tone in the bilateral upper extremities.  The tone in the lower extremities is normal.  Abnormal movements: there is mild irregular rest tremor.  There is mild tremor of the outstretched hands.  Not worse with intention or when given a weight.  he has difficulty with archimedes spirals bilaterally.  he has mild difficulty when asked to pour a full glass of water from one glass to another. Coordination:  There is no decremation with RAM's, with any form of RAMS, including alternating supination and pronation of the forearm, hand opening and closing, finger taps, heel taps and toe taps. Gait and Station: The  patient has mild difficulty arising out of a deep-seated chair without the use of the hands. The patient's stride length is normal but he is very wide based even with the cane.  He is mildly unstable  ASSESSMENT/PLAN:  1.   Tremor  -multifactorial.  I think that there is a contributing factor of anxiety and essential tremor.  I think that EtOH could play a role as well as he was pretty evasive about this as well.  Patient education was provided to the patient and family.  -currently getting off of propranolol because of renal function and tremor may get worse as he weans that.  He took propranolol today.  Discussed primidone.  He really is not interested  2.  Peripheral neuropathy  -The patient has clinical examination evidence of a diffuse peripheral neuropathy, which certainly can affect gait and balance.  We discussed safety associated with peripheral neuropathy.  We discussed balance therapy and the importance of ambulatory assistive device for balance assistance.  He thinks that this was caused by cisplatin.  This is certainly known to cause PN.  I did explain to the patient that once one has peripheral neuropathy, they cannot reverse it, but balance therapy can help the balance aspect.  -He was given a RX for PT as [place] place [place].    -He has had some difficulty tolerating medications for neuropathy in the past and does not want anything further right now.  If he wants something further in the future, I may let him see my partner, Dr. Posey Pronto.  3.  Neck and back pain.    -He describes lhermittes phenomenon.  MRI cervical spine was done in 2014.  I cannot see the films.  He refuses a rescan stating he wouldn't do anything about it.  He also describes severe LBP with multiple falls and I wanted to do MRI lumbar spine but he refused.  He stated that even if he had a lesion that would cause paralysis, he would  not  want a surgery.  I explained to him the potential serious nature, and he expressed understanding as did his wife.  4. .  Anxiety  -pt is currently on 2 benzodiazepines and I would encourage something other than this for the control of anxiety and insomnia.  He is going to be seeing a counselor soon.  5.  He will let me know if he wants to follow-up here, based on what happens as he weans his propranolol.  Much greater than 50% of this 60-minute visit was spent in counseling with the patient and his wife.  Cc:  [name], PA-C

## 2017-05-08 ENCOUNTER — Encounter: Payer: Self-pay | Admitting: Neurology

## 2017-05-08 ENCOUNTER — Ambulatory Visit: Payer: Medicare Other | Admitting: Neurology

## 2017-05-08 VITALS — BP 124/88 | HR 84 | Ht 70.0 in | Wt 197.0 lb

## 2017-05-08 DIAGNOSIS — G250 Essential tremor: Secondary | ICD-10-CM

## 2017-05-08 DIAGNOSIS — G620 Drug-induced polyneuropathy: Secondary | ICD-10-CM

## 2017-05-08 DIAGNOSIS — F411 Generalized anxiety disorder: Secondary | ICD-10-CM | POA: Diagnosis not present

## 2017-05-08 DIAGNOSIS — G609 Hereditary and idiopathic neuropathy, unspecified: Secondary | ICD-10-CM

## 2017-05-08 DIAGNOSIS — T451X5A Adverse effect of antineoplastic and immunosuppressive drugs, initial encounter: Secondary | ICD-10-CM | POA: Diagnosis not present

## 2017-06-19 DIAGNOSIS — F419 Anxiety disorder, unspecified: Secondary | ICD-10-CM | POA: Diagnosis not present

## 2017-06-19 DIAGNOSIS — E785 Hyperlipidemia, unspecified: Secondary | ICD-10-CM | POA: Diagnosis not present

## 2017-06-19 DIAGNOSIS — I119 Hypertensive heart disease without heart failure: Secondary | ICD-10-CM | POA: Diagnosis not present

## 2017-06-19 DIAGNOSIS — E039 Hypothyroidism, unspecified: Secondary | ICD-10-CM | POA: Diagnosis not present

## 2018-02-06 DIAGNOSIS — E785 Hyperlipidemia, unspecified: Secondary | ICD-10-CM | POA: Diagnosis not present

## 2018-02-06 DIAGNOSIS — Z79899 Other long term (current) drug therapy: Secondary | ICD-10-CM | POA: Diagnosis not present

## 2018-02-06 DIAGNOSIS — G4700 Insomnia, unspecified: Secondary | ICD-10-CM | POA: Diagnosis not present

## 2018-02-06 DIAGNOSIS — E039 Hypothyroidism, unspecified: Secondary | ICD-10-CM | POA: Diagnosis not present

## 2018-02-06 DIAGNOSIS — I119 Hypertensive heart disease without heart failure: Secondary | ICD-10-CM | POA: Diagnosis not present

## 2018-02-06 DIAGNOSIS — F419 Anxiety disorder, unspecified: Secondary | ICD-10-CM | POA: Diagnosis not present

## 2018-02-06 DIAGNOSIS — Z125 Encounter for screening for malignant neoplasm of prostate: Secondary | ICD-10-CM | POA: Diagnosis not present

## 2018-02-06 DIAGNOSIS — Z9221 Personal history of antineoplastic chemotherapy: Secondary | ICD-10-CM | POA: Diagnosis not present

## 2020-09-23 DIAGNOSIS — R21 Rash and other nonspecific skin eruption: Secondary | ICD-10-CM | POA: Diagnosis not present

## 2020-09-23 DIAGNOSIS — L03116 Cellulitis of left lower limb: Secondary | ICD-10-CM | POA: Diagnosis not present

## 2020-09-23 DIAGNOSIS — L03115 Cellulitis of right lower limb: Secondary | ICD-10-CM | POA: Diagnosis not present

## 2020-09-23 DIAGNOSIS — I10 Essential (primary) hypertension: Secondary | ICD-10-CM | POA: Diagnosis not present

## 2020-11-03 DIAGNOSIS — E559 Vitamin D deficiency, unspecified: Secondary | ICD-10-CM | POA: Diagnosis not present

## 2020-11-03 DIAGNOSIS — I119 Hypertensive heart disease without heart failure: Secondary | ICD-10-CM | POA: Diagnosis not present

## 2020-11-03 DIAGNOSIS — I10 Essential (primary) hypertension: Secondary | ICD-10-CM | POA: Diagnosis not present

## 2020-11-03 DIAGNOSIS — E039 Hypothyroidism, unspecified: Secondary | ICD-10-CM | POA: Diagnosis not present

## 2020-11-03 DIAGNOSIS — E538 Deficiency of other specified B group vitamins: Secondary | ICD-10-CM | POA: Diagnosis not present

## 2021-03-12 DIAGNOSIS — I10 Essential (primary) hypertension: Secondary | ICD-10-CM | POA: Diagnosis not present

## 2021-03-12 DIAGNOSIS — E039 Hypothyroidism, unspecified: Secondary | ICD-10-CM | POA: Diagnosis not present

## 2021-03-12 DIAGNOSIS — R296 Repeated falls: Secondary | ICD-10-CM | POA: Diagnosis not present

## 2021-03-18 DIAGNOSIS — Z9221 Personal history of antineoplastic chemotherapy: Secondary | ICD-10-CM | POA: Diagnosis not present

## 2021-03-18 DIAGNOSIS — Z95810 Presence of automatic (implantable) cardiac defibrillator: Secondary | ICD-10-CM | POA: Diagnosis not present

## 2021-03-18 DIAGNOSIS — R296 Repeated falls: Secondary | ICD-10-CM | POA: Diagnosis not present

## 2021-03-18 DIAGNOSIS — S0101XA Laceration without foreign body of scalp, initial encounter: Secondary | ICD-10-CM | POA: Diagnosis not present

## 2021-03-18 DIAGNOSIS — C099 Malignant neoplasm of tonsil, unspecified: Secondary | ICD-10-CM | POA: Diagnosis not present

## 2021-03-18 DIAGNOSIS — S199XXA Unspecified injury of neck, initial encounter: Secondary | ICD-10-CM | POA: Diagnosis not present

## 2021-03-18 DIAGNOSIS — Z886 Allergy status to analgesic agent status: Secondary | ICD-10-CM | POA: Diagnosis not present

## 2021-03-18 DIAGNOSIS — Z79899 Other long term (current) drug therapy: Secondary | ICD-10-CM | POA: Diagnosis not present

## 2021-03-18 DIAGNOSIS — Z881 Allergy status to other antibiotic agents status: Secondary | ICD-10-CM | POA: Diagnosis not present

## 2021-03-18 DIAGNOSIS — E039 Hypothyroidism, unspecified: Secondary | ICD-10-CM | POA: Diagnosis not present

## 2021-03-18 DIAGNOSIS — T451X5A Adverse effect of antineoplastic and immunosuppressive drugs, initial encounter: Secondary | ICD-10-CM | POA: Diagnosis not present

## 2021-03-18 DIAGNOSIS — I10 Essential (primary) hypertension: Secondary | ICD-10-CM | POA: Diagnosis not present

## 2021-03-18 DIAGNOSIS — S0100XA Unspecified open wound of scalp, initial encounter: Secondary | ICD-10-CM | POA: Diagnosis not present

## 2021-03-18 DIAGNOSIS — Z923 Personal history of irradiation: Secondary | ICD-10-CM | POA: Diagnosis not present

## 2021-03-18 DIAGNOSIS — Z23 Encounter for immunization: Secondary | ICD-10-CM | POA: Diagnosis not present

## 2021-03-18 DIAGNOSIS — I462 Cardiac arrest due to underlying cardiac condition: Secondary | ICD-10-CM | POA: Diagnosis not present

## 2021-03-18 DIAGNOSIS — G620 Drug-induced polyneuropathy: Secondary | ICD-10-CM | POA: Diagnosis not present

## 2021-03-18 DIAGNOSIS — I2510 Atherosclerotic heart disease of native coronary artery without angina pectoris: Secondary | ICD-10-CM | POA: Diagnosis not present

## 2021-03-18 DIAGNOSIS — I472 Ventricular tachycardia: Secondary | ICD-10-CM | POA: Diagnosis not present

## 2021-03-18 DIAGNOSIS — I77810 Thoracic aortic ectasia: Secondary | ICD-10-CM | POA: Diagnosis not present

## 2021-03-18 DIAGNOSIS — Z9181 History of falling: Secondary | ICD-10-CM | POA: Diagnosis not present

## 2021-03-18 DIAGNOSIS — Z85818 Personal history of malignant neoplasm of other sites of lip, oral cavity, and pharynx: Secondary | ICD-10-CM | POA: Diagnosis not present

## 2021-03-18 DIAGNOSIS — G252 Other specified forms of tremor: Secondary | ICD-10-CM | POA: Diagnosis not present

## 2021-03-18 DIAGNOSIS — E871 Hypo-osmolality and hyponatremia: Secondary | ICD-10-CM | POA: Diagnosis not present

## 2021-03-18 DIAGNOSIS — R55 Syncope and collapse: Secondary | ICD-10-CM | POA: Diagnosis not present

## 2021-03-18 DIAGNOSIS — S0990XA Unspecified injury of head, initial encounter: Secondary | ICD-10-CM | POA: Diagnosis not present

## 2021-03-18 DIAGNOSIS — S299XXA Unspecified injury of thorax, initial encounter: Secondary | ICD-10-CM | POA: Diagnosis not present

## 2021-03-18 DIAGNOSIS — G250 Essential tremor: Secondary | ICD-10-CM | POA: Diagnosis not present

## 2021-03-18 DIAGNOSIS — I498 Other specified cardiac arrhythmias: Secondary | ICD-10-CM | POA: Diagnosis not present

## 2021-03-18 DIAGNOSIS — Z20822 Contact with and (suspected) exposure to covid-19: Secondary | ICD-10-CM | POA: Diagnosis not present

## 2021-03-18 DIAGNOSIS — N179 Acute kidney failure, unspecified: Secondary | ICD-10-CM | POA: Diagnosis not present

## 2021-03-18 DIAGNOSIS — Z880 Allergy status to penicillin: Secondary | ICD-10-CM | POA: Diagnosis not present

## 2021-03-19 DIAGNOSIS — I472 Ventricular tachycardia: Secondary | ICD-10-CM | POA: Diagnosis not present

## 2021-03-19 DIAGNOSIS — I77810 Thoracic aortic ectasia: Secondary | ICD-10-CM | POA: Diagnosis not present

## 2021-03-22 DIAGNOSIS — R55 Syncope and collapse: Secondary | ICD-10-CM | POA: Diagnosis not present

## 2021-03-22 DIAGNOSIS — I472 Ventricular tachycardia: Secondary | ICD-10-CM | POA: Diagnosis not present

## 2021-03-23 DIAGNOSIS — I472 Ventricular tachycardia: Secondary | ICD-10-CM | POA: Diagnosis not present

## 2021-03-23 DIAGNOSIS — R55 Syncope and collapse: Secondary | ICD-10-CM | POA: Diagnosis not present

## 2021-03-23 DIAGNOSIS — I2510 Atherosclerotic heart disease of native coronary artery without angina pectoris: Secondary | ICD-10-CM | POA: Diagnosis not present

## 2021-03-24 DIAGNOSIS — I462 Cardiac arrest due to underlying cardiac condition: Secondary | ICD-10-CM | POA: Diagnosis not present

## 2021-03-24 DIAGNOSIS — I2510 Atherosclerotic heart disease of native coronary artery without angina pectoris: Secondary | ICD-10-CM | POA: Diagnosis not present

## 2021-03-24 DIAGNOSIS — I472 Ventricular tachycardia: Secondary | ICD-10-CM | POA: Diagnosis not present

## 2021-03-27 DIAGNOSIS — U071 COVID-19: Secondary | ICD-10-CM | POA: Diagnosis not present

## 2021-03-27 DIAGNOSIS — Z95810 Presence of automatic (implantable) cardiac defibrillator: Secondary | ICD-10-CM | POA: Diagnosis not present

## 2021-03-27 DIAGNOSIS — R079 Chest pain, unspecified: Secondary | ICD-10-CM | POA: Diagnosis not present

## 2021-03-27 DIAGNOSIS — Z9889 Other specified postprocedural states: Secondary | ICD-10-CM | POA: Diagnosis not present

## 2021-03-27 DIAGNOSIS — M25512 Pain in left shoulder: Secondary | ICD-10-CM | POA: Diagnosis not present

## 2021-03-27 DIAGNOSIS — R918 Other nonspecific abnormal finding of lung field: Secondary | ICD-10-CM | POA: Diagnosis not present

## 2021-03-27 DIAGNOSIS — E039 Hypothyroidism, unspecified: Secondary | ICD-10-CM | POA: Diagnosis not present

## 2021-03-27 DIAGNOSIS — G8918 Other acute postprocedural pain: Secondary | ICD-10-CM | POA: Diagnosis not present

## 2021-03-27 DIAGNOSIS — J1282 Pneumonia due to coronavirus disease 2019: Secondary | ICD-10-CM | POA: Diagnosis not present

## 2021-03-27 DIAGNOSIS — I2510 Atherosclerotic heart disease of native coronary artery without angina pectoris: Secondary | ICD-10-CM | POA: Diagnosis not present

## 2021-03-27 DIAGNOSIS — I10 Essential (primary) hypertension: Secondary | ICD-10-CM | POA: Diagnosis not present

## 2021-03-27 DIAGNOSIS — J90 Pleural effusion, not elsewhere classified: Secondary | ICD-10-CM | POA: Diagnosis not present

## 2021-04-07 DIAGNOSIS — E871 Hypo-osmolality and hyponatremia: Secondary | ICD-10-CM | POA: Diagnosis not present

## 2021-04-07 DIAGNOSIS — I10 Essential (primary) hypertension: Secondary | ICD-10-CM | POA: Diagnosis not present

## 2021-04-07 DIAGNOSIS — Z0289 Encounter for other administrative examinations: Secondary | ICD-10-CM | POA: Diagnosis not present

## 2021-04-07 DIAGNOSIS — E039 Hypothyroidism, unspecified: Secondary | ICD-10-CM | POA: Diagnosis not present

## 2021-04-07 DIAGNOSIS — F5101 Primary insomnia: Secondary | ICD-10-CM | POA: Diagnosis not present

## 2021-04-12 DIAGNOSIS — E875 Hyperkalemia: Secondary | ICD-10-CM | POA: Diagnosis not present

## 2021-06-22 DIAGNOSIS — Z95810 Presence of automatic (implantable) cardiac defibrillator: Secondary | ICD-10-CM | POA: Diagnosis not present

## 2021-07-13 DIAGNOSIS — E039 Hypothyroidism, unspecified: Secondary | ICD-10-CM | POA: Diagnosis not present

## 2021-07-13 DIAGNOSIS — G4700 Insomnia, unspecified: Secondary | ICD-10-CM | POA: Diagnosis not present

## 2021-07-13 DIAGNOSIS — I10 Essential (primary) hypertension: Secondary | ICD-10-CM | POA: Diagnosis not present

## 2021-10-13 DIAGNOSIS — E559 Vitamin D deficiency, unspecified: Secondary | ICD-10-CM | POA: Diagnosis not present

## 2021-10-13 DIAGNOSIS — I10 Essential (primary) hypertension: Secondary | ICD-10-CM | POA: Diagnosis not present

## 2021-10-13 DIAGNOSIS — E871 Hypo-osmolality and hyponatremia: Secondary | ICD-10-CM | POA: Diagnosis not present

## 2021-10-13 DIAGNOSIS — E039 Hypothyroidism, unspecified: Secondary | ICD-10-CM | POA: Diagnosis not present

## 2021-10-13 DIAGNOSIS — E875 Hyperkalemia: Secondary | ICD-10-CM | POA: Diagnosis not present

## 2021-10-13 DIAGNOSIS — F5101 Primary insomnia: Secondary | ICD-10-CM | POA: Diagnosis not present

## 2021-10-13 DIAGNOSIS — E538 Deficiency of other specified B group vitamins: Secondary | ICD-10-CM | POA: Diagnosis not present

## 2021-11-25 DIAGNOSIS — S61459A Open bite of unspecified hand, initial encounter: Secondary | ICD-10-CM | POA: Diagnosis not present

## 2021-12-02 DIAGNOSIS — S61251D Open bite of left index finger without damage to nail, subsequent encounter: Secondary | ICD-10-CM | POA: Diagnosis not present

## 2021-12-02 DIAGNOSIS — S61250D Open bite of right index finger without damage to nail, subsequent encounter: Secondary | ICD-10-CM | POA: Diagnosis not present

## 2021-12-02 DIAGNOSIS — Z859 Personal history of malignant neoplasm, unspecified: Secondary | ICD-10-CM | POA: Diagnosis not present

## 2021-12-02 DIAGNOSIS — F5101 Primary insomnia: Secondary | ICD-10-CM | POA: Diagnosis not present

## 2021-12-02 DIAGNOSIS — R110 Nausea: Secondary | ICD-10-CM | POA: Diagnosis not present

## 2022-01-19 DIAGNOSIS — E538 Deficiency of other specified B group vitamins: Secondary | ICD-10-CM | POA: Diagnosis not present

## 2022-01-19 DIAGNOSIS — E871 Hypo-osmolality and hyponatremia: Secondary | ICD-10-CM | POA: Diagnosis not present

## 2022-01-19 DIAGNOSIS — E039 Hypothyroidism, unspecified: Secondary | ICD-10-CM | POA: Diagnosis not present

## 2022-01-19 DIAGNOSIS — E559 Vitamin D deficiency, unspecified: Secondary | ICD-10-CM | POA: Diagnosis not present

## 2022-01-19 DIAGNOSIS — I10 Essential (primary) hypertension: Secondary | ICD-10-CM | POA: Diagnosis not present

## 2022-01-19 DIAGNOSIS — G4700 Insomnia, unspecified: Secondary | ICD-10-CM | POA: Diagnosis not present

## 2022-03-04 ENCOUNTER — Telehealth: Payer: Self-pay | Admitting: *Deleted

## 2022-03-04 NOTE — Patient Outreach (Signed)
  Care Coordination   03/04/2022 Name: Samuel Leon MRN: [id number] DOB: 08/02/1955   Care Coordination Outreach Attempts:  An unsuccessful telephone outreach was attempted today to offer the patient information about available care coordination services as a benefit of their health plan.   Follow Up Plan:  Additional outreach attempts will be made to offer the patient care coordination information and services.   Encounter Outcome:  No Answer  Care Coordination Interventions Activated:  No   Care Coordination Interventions:  No, not indicated    Raina Mina, RN Care Management Coordinator [place] [phone]

## 2022-03-30 ENCOUNTER — Telehealth: Payer: Self-pay

## 2022-03-30 NOTE — Patient Outreach (Signed)
  Care Coordination   03/30/2022 Name: Samuel Leon MRN: [id number] DOB: [birth year]   Care Coordination Outreach Attempts:  A second unsuccessful outreach was attempted today to offer the patient with information about available care coordination services as a benefit of their health plan.     Follow Up Plan:  Additional outreach attempts will be made to offer the patient care coordination information and services.   Encounter Outcome:  No Answer  Care Coordination Interventions Activated:  No   Care Coordination Interventions:  No, not indicated    Peter Garter RN, BSN,CCM, [facility] [phone]

## 2022-04-06 ENCOUNTER — Telehealth: Payer: Self-pay

## 2022-04-06 NOTE — Patient Outreach (Signed)
  Care Coordination   04/06/2022 Name: Samuel Leon MRN: [id number] DOB: 09-Oct-1955   Care Coordination Outreach Attempts:  A third unsuccessful outreach was attempted today to offer the patient with information about available care coordination services as a benefit of their health plan.   Follow Up Plan:  No further outreach attempts will be made at this time. We have been unable to contact the patient to offer or enroll patient in care coordination services  Encounter Outcome:  No Answer  Care Coordination Interventions Activated:  No   Care Coordination Interventions:  No, not indicated    Peter Garter RN, BSN,CCM, [facility] [phone]
